# Patient Record
Sex: Female | Born: 1985 | Race: White | Hispanic: No | Marital: Married | Smoking: Former smoker
Health system: Southern US, Community
[De-identification: ages and names within clinical notes are randomized; demographics above are authoritative.]

## PROBLEM LIST (undated history)

## (undated) ENCOUNTER — Inpatient Hospital Stay (HOSPITAL_COMMUNITY): Payer: Self-pay

## (undated) DIAGNOSIS — O4290 Premature rupture of membranes, unspecified as to length of time between rupture and onset of labor, unspecified weeks of gestation: Secondary | ICD-10-CM

## (undated) DIAGNOSIS — Z3490 Encounter for supervision of normal pregnancy, unspecified, unspecified trimester: Secondary | ICD-10-CM

## (undated) DIAGNOSIS — L309 Dermatitis, unspecified: Secondary | ICD-10-CM

## (undated) DIAGNOSIS — Z87448 Personal history of other diseases of urinary system: Secondary | ICD-10-CM

## (undated) DIAGNOSIS — R87619 Unspecified abnormal cytological findings in specimens from cervix uteri: Secondary | ICD-10-CM

## (undated) DIAGNOSIS — IMO0002 Reserved for concepts with insufficient information to code with codable children: Secondary | ICD-10-CM

## (undated) DIAGNOSIS — E669 Obesity, unspecified: Secondary | ICD-10-CM

## (undated) DIAGNOSIS — F419 Anxiety disorder, unspecified: Secondary | ICD-10-CM

## (undated) HISTORY — PX: TONSILLECTOMY: SUR1361

## (undated) HISTORY — DX: Anxiety disorder, unspecified: F41.9

## (undated) HISTORY — DX: Personal history of other diseases of urinary system: Z87.448

## (undated) HISTORY — DX: Dermatitis, unspecified: L30.9

## (undated) HISTORY — PX: COLPOSCOPY: SHX161

---
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
## 2005-01-22 ENCOUNTER — Emergency Department: Payer: Self-pay | Admitting: Emergency Medicine

## 2007-12-12 ENCOUNTER — Emergency Department: Payer: Self-pay | Admitting: Emergency Medicine

## 2008-04-16 ENCOUNTER — Ambulatory Visit: Payer: Self-pay | Admitting: Family Medicine

## 2009-02-28 ENCOUNTER — Emergency Department: Payer: Self-pay | Admitting: Emergency Medicine

## 2010-01-24 NOTE — L&D Delivery Note (Signed)
This is Dr. Ambrose Mantle dictating a delivery note on [facility]. This lady progressed to full dilatation, pushed well, and delivered a living 7 lbs. 7 oz. Female infant with Apgars of 9 at one and 9 at 5 minutes. The patient wanted her blood collected for the blood bank, so after I collected the routine cord blood studies I did a sterile cord blood collection. The placenta had separated partially so Pitocin was begun and the placenta delivered intact. The uterus was examined and was free of any remaining products of conception. A rectal exam was done and there was no rectal injury. A second degree midline laceration was repaired with 3-0 Vicryl under local 1% Xylocaine block. Re-exam of the uterus showed no clots in the uterus and the procedure was terminated. Blood loss was about 400 cc.

## 2010-08-02 ENCOUNTER — Inpatient Hospital Stay (HOSPITAL_COMMUNITY): Payer: 59 | Admitting: Anesthesiology

## 2010-08-02 ENCOUNTER — Encounter (HOSPITAL_COMMUNITY): Payer: Self-pay | Admitting: Anesthesiology

## 2010-08-02 ENCOUNTER — Inpatient Hospital Stay (HOSPITAL_COMMUNITY)
Admission: AD | Admit: 2010-08-02 | Discharge: 2010-08-04 | DRG: 775 | Disposition: A | Discharge status: Home / Self Care | Payer: 59 | Source: Ambulatory Visit | Attending: Obstetrics and Gynecology | Admitting: Obstetrics and Gynecology

## 2010-08-02 ENCOUNTER — Encounter (HOSPITAL_COMMUNITY): Payer: Self-pay | Admitting: *Deleted

## 2010-08-02 DIAGNOSIS — O4290 Premature rupture of membranes, unspecified as to length of time between rupture and onset of labor, unspecified weeks of gestation: Secondary | ICD-10-CM | POA: Diagnosis present

## 2010-08-02 HISTORY — DX: Unspecified abnormal cytological findings in specimens from cervix uteri: R87.619

## 2010-08-02 HISTORY — DX: Reserved for concepts with insufficient information to code with codable children: IMO0002

## 2010-08-02 HISTORY — DX: Premature rupture of membranes, unspecified as to length of time between rupture and onset of labor, unspecified weeks of gestation: O42.90

## 2010-08-02 LAB — CBC
HCT: 35.5 % — ABNORMAL LOW (ref 36.0–46.0)
Hemoglobin: 11.6 g/dL — ABNORMAL LOW (ref 12.0–15.0)
MCH: 28 pg (ref 26.0–34.0)
MCHC: 32.7 g/dL (ref 30.0–36.0)
MCV: 85.5 fL (ref 78.0–100.0)
Platelets: 281 10*3/uL (ref 150–400)
RBC: 4.15 MIL/uL (ref 3.87–5.11)
RDW: 14.4 % (ref 11.5–15.5)
WBC: 13.4 10*3/uL — ABNORMAL HIGH (ref 4.0–10.5)

## 2010-08-02 LAB — STREP B DNA PROBE: GBS: NEGATIVE

## 2010-08-02 LAB — HEPATITIS B SURFACE ANTIGEN: Hepatitis B Surface Ag: NEGATIVE

## 2010-08-02 LAB — RUBELLA ANTIBODY, IGM: Rubella: IMMUNE

## 2010-08-02 LAB — RPR
RPR Ser Ql: NONREACTIVE
RPR: NONREACTIVE
RPR: NONREACTIVE

## 2010-08-02 LAB — HIV ANTIBODY (ROUTINE TESTING W REFLEX)
HIV: NONREACTIVE
HIV: NONREACTIVE

## 2010-08-02 LAB — ABO/RH: RH Type: POSITIVE

## 2010-08-02 LAB — TYPE AND SCREEN: Antibody Screen: NEGATIVE

## 2010-08-02 MED ORDER — ERYTHROMYCIN 5 MG/GM OP OINT
TOPICAL_OINTMENT | OPHTHALMIC | Status: AC
  Filled 2010-08-02: qty 1

## 2010-08-02 MED ORDER — LIDOCAINE HCL (PF) 1 % IJ SOLN
30.0000 mL | Freq: Once | INTRAMUSCULAR | Status: DC | PRN
  Administered 2010-08-02: 30 mL via SUBCUTANEOUS
  Filled 2010-08-02: qty 30

## 2010-08-02 MED ORDER — LACTATED RINGERS IV SOLN: 500.0000 mL | Freq: Once | INTRAVENOUS | Status: DC | PRN

## 2010-08-02 MED ORDER — LIDOCAINE HCL 1.5 % IJ SOLN
INTRAMUSCULAR | Status: DC | PRN
  Administered 2010-08-02: 10 mL

## 2010-08-02 MED ORDER — EPHEDRINE 5 MG/ML INJ
10.0000 mg | INTRAVENOUS | Status: DC | PRN
  Filled 2010-08-02: qty 4

## 2010-08-02 MED ORDER — BENZOCAINE-MENTHOL 20-0.5 % EX AERO
INHALATION_SPRAY | CUTANEOUS | Status: AC
  Filled 2010-08-02: qty 56

## 2010-08-02 MED ORDER — IBUPROFEN 600 MG PO TABS: 600.0000 mg | ORAL_TABLET | Freq: Four times a day (QID) | ORAL | Status: DC | PRN

## 2010-08-02 MED ORDER — LACTATED RINGERS IV SOLN
INTRAVENOUS | Status: DC
  Administered 2010-08-02: 500 mL via INTRAVENOUS
  Administered 2010-08-02 (×2): via INTRAVENOUS

## 2010-08-02 MED ORDER — OXYTOCIN 20 UNITS IN LACTATED RINGERS INFUSION - SIMPLE
2.0000 m[IU]/min | INTRAVENOUS | Status: DC
  Administered 2010-08-02: 8 m[IU]/min via INTRAVENOUS
  Administered 2010-08-02: 2 m[IU]/min via INTRAVENOUS
  Filled 2010-08-02: qty 1000

## 2010-08-02 MED ORDER — PRENATAL PLUS 27-1 MG PO TABS
1.0000 | ORAL_TABLET | Freq: Every day | ORAL | Status: DC
Start: 2010-08-02 — End: 2010-08-02

## 2010-08-02 MED ORDER — LACTATED RINGERS IV SOLN: 500.0000 mL | Freq: Once | INTRAVENOUS | Status: DC

## 2010-08-02 MED ORDER — CITRIC ACID-SODIUM CITRATE 334-500 MG/5ML PO SOLN: 30.0000 mL | ORAL | Status: DC | PRN

## 2010-08-02 MED ORDER — FENTANYL 2.5 MCG/ML BUPIVACAINE 1/10 % EPIDURAL INFUSION (WH - ANES)
2.0000 mL/h | INTRAMUSCULAR | Status: DC
  Administered 2010-08-02 (×3): 14 mL/h via EPIDURAL
  Filled 2010-08-02 (×3): qty 60

## 2010-08-02 MED ORDER — DIPHENHYDRAMINE HCL 50 MG/ML IJ SOLN: 12.5000 mg | INTRAMUSCULAR | Status: DC | PRN

## 2010-08-02 MED ORDER — ACETAMINOPHEN 325 MG PO TABS: 650.0000 mg | ORAL_TABLET | ORAL | Status: DC | PRN

## 2010-08-02 MED ORDER — PHENYLEPHRINE 40 MCG/ML (10ML) SYRINGE FOR IV PUSH (FOR BLOOD PRESSURE SUPPORT): 80.0000 ug | PREFILLED_SYRINGE | INTRAVENOUS | Status: DC | PRN

## 2010-08-02 MED ORDER — OXYCODONE-ACETAMINOPHEN 5-325 MG PO TABS
ORAL_TABLET | ORAL | Status: AC
  Administered 2010-08-02: 1 via ORAL
  Filled 2010-08-02: qty 1

## 2010-08-02 MED ORDER — FLEET ENEMA 7-19 GM/118ML RE ENEM: 1.0000 | ENEMA | RECTAL | Status: DC | PRN

## 2010-08-02 MED ORDER — TERBUTALINE SULFATE 1 MG/ML IJ SOLN: 0.2500 mg | Freq: Once | INTRAMUSCULAR | Status: DC | PRN

## 2010-08-02 MED ORDER — OXYTOCIN 20 UNITS IN LACTATED RINGERS INFUSION - SIMPLE
125.0000 mL/h | Freq: Once | INTRAVENOUS | Status: DC
  Administered 2010-08-02: 999 mL/h via INTRAVENOUS

## 2010-08-02 MED ORDER — EPHEDRINE 5 MG/ML INJ: 10.0000 mg | INTRAVENOUS | Status: DC | PRN

## 2010-08-02 MED ORDER — PHENYLEPHRINE 40 MCG/ML (10ML) SYRINGE FOR IV PUSH (FOR BLOOD PRESSURE SUPPORT)
80.0000 ug | PREFILLED_SYRINGE | INTRAVENOUS | Status: DC | PRN
  Filled 2010-08-02: qty 5

## 2010-08-02 MED ORDER — ONDANSETRON HCL 4 MG/2ML IJ SOLN: 4.0000 mg | Freq: Four times a day (QID) | INTRAMUSCULAR | Status: DC | PRN

## 2010-08-02 NOTE — Initial Assessments (Signed)
Pt G1 at 40.3wks, reports leaking clear fluid with small amt of blood since 0200, and having contractions every .  Pt denies any problems with pregnancy.

## 2010-08-02 NOTE — H&P (Signed)
Ann [name] is a 25 y.o. female presenting for SROM at 40 3/7 weeks  (EDD 07/30/10 by LMP).  Transferred to our practice from another at 35 weeks with uncomplicated care. Maternal Medical History:  Reason for admission: Reason for admission: rupture of membranes.  Contractions: Onset was 3-5 hours ago.   Frequency: irregular.   Duration is approximately 45 seconds.   Perceived severity is mild.    Fetal activity: Perceived fetal activity is normal.   Last perceived fetal movement was within the past hour.    Prenatal complications: no prenatal complications   OB History    Grav Para Term Preterm Abortions TAB SAB Ect Mult Living   1         0     Past Medical History  Diagnosis Date  . Abnormal Pap smear     biopsy in september   Past Surgical History  Procedure Date  . Tonsillectomy    Family History: family history is not on file. Social History:  reports that she has never smoked. She does not have any smokeless tobacco history on file. She reports that she does not drink alcohol or use illicit drugs.  Review of Systems  Constitutional: Negative.   HENT: Negative.   Respiratory: Negative.   Cardiovascular: Negative.   Genitourinary: Negative.   Skin: Negative.   All other systems reviewed and are negative.    Dilation: 2 Effacement (%): 50 Station: -1 Exam by:: Rudi Coco RN Blood pressure 128/75, pulse 84, temperature 98 F (36.7 C), temperature source Oral, resp. rate 20, height 5\' 3"  (1.6 m), weight 93.078 kg (205 lb 3.2 oz). Maternal Exam:  Uterine Assessment: Contraction strength is mild.  Contraction duration is 45 seconds. Contraction frequency is irregular.   Abdomen: Patient reports no abdominal tenderness. Fundal height is 37.   Estimated fetal weight is 7lbs.   Fetal presentation: vertex  Introitus: Normal vulva. Normal vagina.    Physical Exam  Constitutional: She is oriented to person, place, and time. She appears well-developed.  Eyes: Pupils  are equal, round, and reactive to light.  Neck: Neck supple.  Cardiovascular: Normal rate and regular rhythm.   Respiratory: Effort normal and breath sounds normal.  GI: Soft.  Genitourinary: Vagina normal.  Musculoskeletal: Normal range of motion.  Neurological: She is oriented to person, place, and time.  Skin: Skin is warm.  Psychiatric: Her behavior is normal.    Prenatal labs: ABO, Rh:  A positive Antibody: Negative (07/09 0000) Rubella:  Immune RPR: Nonreactive (07/09 0000)  HBsAg: Negative (07/09 0000)  HIV: Non-reactive (07/09 0000)  GBS: Negative (07/09 0000)  One hour glucola   100 GC negative Chlam negative Genetic screens not done Assessment/Plan: Pt with SROM at about 200am, irregular contractions only that are mild.  d/w pt and will begin pitocin at 2mu and increase by 2mu increments.  Plans epidural when in labor. Currently pt is comfortable.  Ann [name] 08/02/2010, 6:49 AM

## 2010-08-02 NOTE — Progress Notes (Signed)
rom @ 0200 - clear, pain started an hr later.  G1p0...41wks

## 2010-08-02 NOTE — Anesthesia Procedure Notes (Signed)
Epidural Patient location during procedure: OB Start time: 08/02/2010 7:55 AM Reason for block: procedure for pain  Staffing Performed by: anesthesiologist   Preanesthetic Checklist Completed: patient identified, site marked, surgical consent, pre-op evaluation, timeout performed, IV checked, risks and benefits discussed and monitors and equipment checked  Epidural Patient position: sitting Prep: DuraPrep Patient monitoring: continuous pulse ox Approach: midline Injection technique: LOR saline  Needle Needle type: Tuohy  Needle gauge: 17 G Needle length: 9 cm Catheter type: closed end flexible Catheter size: 19 Gauge Test dose: 1.5% lidocaine  Assessment Events: blood not aspirated, injection not painful, no injection resistance, negative IV test and no paresthesia  Additional Notes Patient identified.  Risk benefits discussed including failed block, incomplete pain control, headache, nerve damage, paralysis, blood pressure changes, reactions to medication both toxic or allergic, and postpartum back pain.  Patient expressed understanding and wished to proceed.  All questions were answered.  Sterile technique used throughout procedure and epidural site dressed with sterile barrier dressing.  Please see nursing notes for vital signs.

## 2010-08-02 NOTE — Anesthesia Preprocedure Evaluation (Signed)
Anesthesia Evaluation  Name, MR# and DOB Patient awake  General Assessment Comment  Reviewed: Allergy & Precautions, H&P  and Patient's Chart, lab work & pertinent test results  Airway Mallampati: II TM Distance: >3 FB Neck ROM: full    Dental   Pulmonary  clear to auscultation    Cardiovascular Exercise Tolerance: Good regular Normal   Neuro/Psych  GI/Hepatic/Renal   Endo/Other   Abdominal   Musculoskeletal  Hematology   Peds  Reproductive/Obstetrics (+) Pregnancy          Anesthesia Physical Anesthesia Plan  ASA: II  Anesthesia Plan: Epidural   Post-op Pain Management:    Induction:   Airway Management Planned:   Additional Equipment:   Intra-op Plan:   Post-operative Plan:   Informed Consent: I have reviewed the patients History and Physical, chart, labs and discussed the procedure including the risks, benefits and alternatives for the proposed anesthesia with the patient or authorized representative who has indicated his/her understanding and acceptance.     Plan Discussed with:   Anesthesia Plan Comments:         Anesthesia Quick Evaluation

## 2010-08-02 NOTE — Progress Notes (Signed)
  There have been some decelerations that have been treated with position change and O2 by mask. The pitocin is at 6 mu/ min and the cervix is 7 cm 100 % and vertex is at 0 station

## 2010-08-02 NOTE — Progress Notes (Signed)
This is Dr. Adorian Gwynne dictating a delivery note on Ann [name]. This lady progressed to full dilatation, pushed well, and delivered a living 7 lbs. 7 oz. Female infant with Apgars of 9 at one and 9 at 5 minutes. The patient wanted her blood collected for the blood bank, so after I collected the routine cord blood studies I did a sterile cord blood collection. The placenta had separated partially so Pitocin was begun and the placenta delivered intact. The uterus was examined and was free of any remaining products of conception. A rectal exam was done and there was no rectal injury. A second degree midline laceration was repaired with 3-0 Vicryl under local 1% Xylocaine block. Re-exam of the uterus showed no clots in the uterus and the procedure was terminated. Blood loss was about 400 cc. 

## 2010-08-02 NOTE — Progress Notes (Signed)
Ann [name] is a 25 y.o. G1P0 at [redacted]w[redacted]d by LMP admitted for SROM  Subjective: Comfortable with epidural  Objective: BP 107/63  Pulse 94  Temp(Src) 98 F (36.7 C) (Oral)  Resp 18  Ht 5\' 3"  (1.6 m)  Wt 93.078 kg (205 lb 3.2 oz)  BMI 36.35 kg/m2  SpO2 99%      FHT:  FHR: 140 bpm, variability: moderate,  accelerations:  Present,  decelerations:  Absent UC:   regular, every 2-3 minutes SVE:   Dilation: 3.5 Effacement (%): 80 Station: -1 Exam by:: Dr Ellyn Hack  Labs: Lab Results  Component Value Date   WBC 13.4* 08/02/2010   HGB 11.6* 08/02/2010   HCT 35.5* 08/02/2010   MCV 85.5 08/02/2010   PLT 281 08/02/2010    Assessment / Plan: Augmentation of labor, progressing well  Labor: Progressing normally, pit at 65mu/min Preeclampsia:  no signs or symptoms of toxicity Fetal Wellbeing:  Category I Pain Control:  Epidural I/D:  n/a Anticipated MOD:  NSVD  BOVARD,Ayo Smoak 08/02/2010, 9:06 AM

## 2010-08-03 LAB — CBC
HCT: 31.7 % — ABNORMAL LOW (ref 36.0–46.0)
Hemoglobin: 10.2 g/dL — ABNORMAL LOW (ref 12.0–15.0)
MCH: 27.7 pg (ref 26.0–34.0)
MCHC: 32.2 g/dL (ref 30.0–36.0)
MCV: 86.1 fL (ref 78.0–100.0)
Platelets: 235 10*3/uL (ref 150–400)
RBC: 3.68 MIL/uL — ABNORMAL LOW (ref 3.87–5.11)
RDW: 14.3 % (ref 11.5–15.5)
WBC: 17.2 10*3/uL — ABNORMAL HIGH (ref 4.0–10.5)

## 2010-08-03 MED ORDER — BENZOCAINE-MENTHOL 20-0.5 % EX AERO: 1.0000 "application " | INHALATION_SPRAY | CUTANEOUS | Status: DC | PRN

## 2010-08-03 MED ORDER — ONDANSETRON HCL 4 MG PO TABS: 4.0000 mg | ORAL_TABLET | ORAL | Status: DC | PRN

## 2010-08-03 MED ORDER — MEASLES, MUMPS & RUBELLA VAC ~~LOC~~ INJ
0.5000 mL | INJECTION | Freq: Once | SUBCUTANEOUS | Status: DC
  Filled 2010-08-03: qty 0.5

## 2010-08-03 MED ORDER — ONDANSETRON HCL 4 MG/2ML IJ SOLN: 4.0000 mg | INTRAMUSCULAR | Status: DC | PRN

## 2010-08-03 MED ORDER — PRENATAL PLUS 27-1 MG PO TABS
1.0000 | ORAL_TABLET | Freq: Every day | ORAL | Status: DC
  Administered 2010-08-04: 1 via ORAL
  Filled 2010-08-03 (×2): qty 1

## 2010-08-03 MED ORDER — OXYCODONE-ACETAMINOPHEN 5-325 MG PO TABS
1.0000 | ORAL_TABLET | ORAL | Status: DC | PRN
  Administered 2010-08-02: 1 via ORAL
  Administered 2010-08-03: 2 via ORAL
  Filled 2010-08-03: qty 2

## 2010-08-03 MED ORDER — ZOLPIDEM TARTRATE 5 MG PO TABS: 5.0000 mg | ORAL_TABLET | Freq: Every evening | ORAL | Status: DC | PRN

## 2010-08-03 MED ORDER — RHO D IMMUNE GLOBULIN 1500 UNIT/2ML IJ SOLN: 300.0000 ug | Freq: Once | INTRAMUSCULAR | Status: DC

## 2010-08-03 MED ORDER — TETANUS-DIPHTH-ACELL PERTUSSIS 5-2.5-18.5 LF-MCG/0.5 IM SUSP
0.5000 mL | Freq: Once | INTRAMUSCULAR | Status: DC
  Filled 2010-08-03: qty 0.5

## 2010-08-03 MED ORDER — OXYTOCIN 20 UNITS IN LACTATED RINGERS INFUSION - SIMPLE: 125.0000 mL/h | INTRAVENOUS | Status: AC

## 2010-08-03 MED ORDER — FAMOTIDINE 20 MG PO TABS
20.0000 mg | ORAL_TABLET | Freq: Two times a day (BID) | ORAL | Status: DC
  Administered 2010-08-03: 20 mg via ORAL
  Filled 2010-08-03: qty 1

## 2010-08-03 MED ORDER — LANOLIN HYDROUS EX OINT: TOPICAL_OINTMENT | CUTANEOUS | Status: DC | PRN

## 2010-08-03 MED ORDER — DIPHENHYDRAMINE HCL 25 MG PO CAPS: 25.0000 mg | ORAL_CAPSULE | Freq: Four times a day (QID) | ORAL | Status: DC | PRN

## 2010-08-03 MED ORDER — SENNOSIDES-DOCUSATE SODIUM 8.6-50 MG PO TABS
1.0000 | ORAL_TABLET | Freq: Every day | ORAL | Status: DC
  Administered 2010-08-03: 1 via ORAL
  Filled 2010-08-03 (×2): qty 2

## 2010-08-03 MED ORDER — WITCH HAZEL-GLYCERIN EX PADS: MEDICATED_PAD | CUTANEOUS | Status: DC | PRN

## 2010-08-03 MED ORDER — SIMETHICONE 80 MG PO CHEW: 80.0000 mg | CHEWABLE_TABLET | ORAL | Status: DC | PRN

## 2010-08-03 MED ORDER — IBUPROFEN 600 MG PO TABS
600.0000 mg | ORAL_TABLET | Freq: Four times a day (QID) | ORAL | Status: DC
  Administered 2010-08-03 – 2010-08-04 (×6): 600 mg via ORAL
  Filled 2010-08-03 (×5): qty 1

## 2010-08-03 NOTE — Progress Notes (Signed)
  PP#1:  Afebrile, BP normal. HGB stable. No complaints.

## 2010-08-04 ENCOUNTER — Encounter (HOSPITAL_COMMUNITY): Payer: Self-pay | Admitting: Obstetrics and Gynecology

## 2010-08-04 DIAGNOSIS — O4290 Premature rupture of membranes, unspecified as to length of time between rupture and onset of labor, unspecified weeks of gestation: Secondary | ICD-10-CM

## 2010-08-04 HISTORY — DX: Premature rupture of membranes, unspecified as to length of time between rupture and onset of labor, unspecified weeks of gestation: O42.90

## 2010-08-04 MED ORDER — IBUPROFEN 600 MG PO TABS: 600.0000 mg | ORAL_TABLET | Freq: Four times a day (QID) | ORAL | Status: AC

## 2010-08-04 NOTE — Progress Notes (Signed)
  Afebrile, BP normal. Ready for discharge.

## 2010-08-04 NOTE — Discharge Summary (Signed)
Obstetric Discharge Summary Reason for Admission: rupture of membranes Prenatal Procedures: none Intrapartum Procedures: spontaneous vaginal delivery Postpartum Procedures: none Complications-Operative and Postpartum: 2 degree perineal laceration  Hemoglobin  Date Value Range Status  08/03/2010 10.2* 12.0-15.0 (g/dL) Final     HCT  Date Value Range Status  08/03/2010 31.7* 36.0-46.0 (%) Final    Discharge Diagnoses: Term Pregnancy-delivered  Discharge Information: Date: 08/04/2010 Activity: pelvic rest Diet: routine Medications: Ibuprophen Condition: improved Instructions: refer to practice specific booklet Discharge to: home Follow-up Information    Follow up with Janaysia Mcleroy F. Make an appointment in 6 weeks.   Contact information:   [employer] [address] [place], [address] [place] [address] [phone]          Newborn Data: Live born  Information for the patient's newborn:  Basha, Krygier [[id number]]  female ; APGAR : 9/9 ; weight ; 7lbs.7 oz. Home with mother.  Cameo Shewell F 08/04/2010, 9:07 AM

## 2010-08-04 NOTE — Progress Notes (Signed)
BREASTFEEDING DISCHARGE TEACHING DONE WITH PATIENT.  LATCH SCORE 9 OBSERVED.  C/O TENDER NIPPLES.  REVIEWED PROPER TECHNIQUES FOR POSITIONING AND LATCH.  COMFORT GELS GIVEN TO PATIENT WITH INSTRUCTIONS.

## 2010-08-19 NOTE — Anesthesia Postprocedure Evaluation (Signed)
  Anesthesia Post-op Note  Patient: Ann [name]  Procedure(s) Performed: * No procedures listed *  Patient stable following vaginal delivery.

## 2011-01-25 NOTE — L&D Delivery Note (Signed)
Delivery Note At 3:47 PM a viable female was delivered via  (Presentation: OA; ROT ).  APGAR: 9,9 ; weight P.   Placenta status: delivered, intact.  Cord: 3 Vessels with the following complications: nuchal .    Anesthesia: Epidural  Episiotomy: none  Lacerations: perineal abrasion Suture Repair: 3.0 vicryl rapide Est. Blood Loss (mL): 400  Mom to postpartum.  Baby to stay with mom.  [name],Ann Simar 01/03/2012, 4:08 PM Br/A+/ IUD PP

## 2011-05-08 ENCOUNTER — Emergency Department: Payer: Self-pay | Admitting: Emergency Medicine

## 2011-06-17 LAB — OB RESULTS CONSOLE RPR: RPR: NONREACTIVE

## 2011-06-17 LAB — OB RESULTS CONSOLE GC/CHLAMYDIA
Chlamydia: NEGATIVE
Gonorrhea: NEGATIVE

## 2011-06-17 LAB — OB RESULTS CONSOLE HIV ANTIBODY (ROUTINE TESTING): HIV: NONREACTIVE

## 2011-10-31 ENCOUNTER — Inpatient Hospital Stay (HOSPITAL_COMMUNITY)
Admission: AD | Admit: 2011-10-31 | Discharge: 2011-11-01 | Disposition: A | Discharge status: Home / Self Care | Payer: 59 | Source: Ambulatory Visit | Attending: Obstetrics and Gynecology | Admitting: Obstetrics and Gynecology

## 2011-10-31 DIAGNOSIS — O99891 Other specified diseases and conditions complicating pregnancy: Secondary | ICD-10-CM | POA: Insufficient documentation

## 2011-10-31 DIAGNOSIS — R079 Chest pain, unspecified: Secondary | ICD-10-CM | POA: Insufficient documentation

## 2011-10-31 DIAGNOSIS — R109 Unspecified abdominal pain: Secondary | ICD-10-CM | POA: Insufficient documentation

## 2011-10-31 DIAGNOSIS — R197 Diarrhea, unspecified: Secondary | ICD-10-CM | POA: Insufficient documentation

## 2011-10-31 DIAGNOSIS — R1013 Epigastric pain: Secondary | ICD-10-CM

## 2011-10-31 NOTE — MAU Note (Signed)
Pt G2 P1 at 31.3wks, diarrhea all day, upper abd pain, chest pain, lower and upper back pain.  Pain seems to move around and radiates from chest to back.  Pt only able to eat one meal today.

## 2011-11-01 ENCOUNTER — Encounter (HOSPITAL_COMMUNITY): Payer: Self-pay | Admitting: *Deleted

## 2011-11-01 LAB — URINALYSIS, ROUTINE W REFLEX MICROSCOPIC
Bilirubin Urine: NEGATIVE
Glucose, UA: NEGATIVE mg/dL
Hgb urine dipstick: NEGATIVE
Ketones, ur: NEGATIVE mg/dL
Nitrite: NEGATIVE
Protein, ur: NEGATIVE mg/dL
Specific Gravity, Urine: 1.01 (ref 1.005–1.030)
Urobilinogen, UA: 0.2 mg/dL (ref 0.0–1.0)
pH: 5.5 (ref 5.0–8.0)

## 2011-11-01 LAB — CBC WITH DIFFERENTIAL/PLATELET
Basophils Absolute: 0 10*3/uL (ref 0.0–0.1)
Basophils Relative: 0 % (ref 0–1)
Eosinophils Absolute: 0.1 10*3/uL (ref 0.0–0.7)
Eosinophils Relative: 1 % (ref 0–5)
HCT: 36.4 % (ref 36.0–46.0)
Hemoglobin: 11.8 g/dL — ABNORMAL LOW (ref 12.0–15.0)
Lymphocytes Relative: 15 % (ref 12–46)
Lymphs Abs: 1.4 10*3/uL (ref 0.7–4.0)
MCH: 26.8 pg (ref 26.0–34.0)
MCHC: 32.4 g/dL (ref 30.0–36.0)
MCV: 82.7 fL (ref 78.0–100.0)
Monocytes Absolute: 0.8 10*3/uL (ref 0.1–1.0)
Monocytes Relative: 8 % (ref 3–12)
Neutro Abs: 7.3 10*3/uL (ref 1.7–7.7)
Neutrophils Relative %: 77 % (ref 43–77)
Platelets: 248 10*3/uL (ref 150–400)
RBC: 4.4 MIL/uL (ref 3.87–5.11)
RDW: 14.5 % (ref 11.5–15.5)
WBC: 9.5 10*3/uL (ref 4.0–10.5)

## 2011-11-01 LAB — COMPREHENSIVE METABOLIC PANEL
ALT: 8 U/L (ref 0–35)
AST: 14 U/L (ref 0–37)
Albumin: 2.8 g/dL — ABNORMAL LOW (ref 3.5–5.2)
Alkaline Phosphatase: 75 U/L (ref 39–117)
BUN: 8 mg/dL (ref 6–23)
CO2: 20 mEq/L (ref 19–32)
Calcium: 8.5 mg/dL (ref 8.4–10.5)
Chloride: 101 mEq/L (ref 96–112)
Creatinine, Ser: 0.72 mg/dL (ref 0.50–1.10)
GFR calc Af Amer: >90 mL/min (ref >90)
GFR calc non Af Amer: >90 mL/min (ref >90)
Glucose, Bld: 100 mg/dL — ABNORMAL HIGH (ref 70–99)
Potassium: 4.1 mEq/L (ref 3.5–5.1)
Sodium: 133 mEq/L — ABNORMAL LOW (ref 135–145)
Total Bilirubin: 0.3 mg/dL (ref 0.3–1.2)
Total Protein: 6.2 g/dL (ref 6.0–8.3)

## 2011-11-01 LAB — LIPASE, BLOOD: Lipase: 41 U/L (ref 11–59)

## 2011-11-01 LAB — URINE MICROSCOPIC-ADD ON

## 2011-11-01 LAB — AMYLASE: Amylase: 77 U/L (ref 0–105)

## 2011-11-01 MED ORDER — GI COCKTAIL ~~LOC~~
30.0000 mL | Freq: Once | ORAL | Status: AC
  Administered 2011-11-01: 30 mL via ORAL
  Filled 2011-11-01: qty 30

## 2011-11-01 MED ORDER — RANITIDINE HCL 150 MG PO TABS: 150.0000 mg | ORAL_TABLET | Freq: Two times a day (BID) | ORAL | Status: DC

## 2011-11-01 NOTE — MAU Provider Note (Signed)
History     CSN: [id number]  Arrival date and time: 10/31/11 2342   First Provider Initiated Contact with Patient 11/01/11 0032      Chief Complaint  Patient presents with  . Diarrhea  . Abdominal Pain  . Back Pain  . Chest Pain   HPI Ann [name] is a 26 y.o. female @ [redacted]w[redacted]d gestation who presents to MAU with chest pain. She describes the pain as sharp that was located in the middle of the chest. The pain radiated through to the back. She rated the pain as 8/10. The pain was worse with lying flat. Patient ate Nachos at 4:30 pm. Now the pain is 4/10. Also complains of upper abdominal pain that radiates to the back. Diarrhea since yesterday. Stools are watery, yellow. Has had 5 or 6 today. Symptoms have improved some since earlier. The history was provided by the patient.  OB History    Grav Para Term Preterm Abortions TAB SAB Ect Mult Living   2 1 1       1       Past Medical History  Diagnosis Date  . Abnormal Pap smear     biopsy in september  . Postpartum care following vaginal delivery 08/04/2010  . ROM (rupture of membranes), premature 08/04/2010    Past Surgical History  Procedure Date  . Tonsillectomy     History reviewed. No pertinent family history.  History  Substance Use Topics  . Smoking status: Former Games developer  . Smokeless tobacco: Not on file  . Alcohol Use: No    Allergies:  Allergies  Allergen Reactions  . Darvocet (Propoxyphene-Acetaminophen)   . Latex     Prescriptions prior to admission  Medication Sig Dispense Refill  . prenatal vitamin w/FE, FA (PRENATAL 1 + 1) 27-1 MG TABS Take 1 tablet by mouth daily.        . ranitidine (ZANTAC) 75 MG tablet Take 75 mg by mouth 2 (two) times daily.          Review of Systems  Constitutional: Negative for fever, chills and weight loss.  HENT: Negative for ear pain, nosebleeds, congestion, sore throat and neck pain.   Eyes: Negative for blurred vision, double vision, photophobia and pain.  Respiratory:  Positive for shortness of breath. Negative for cough and wheezing.   Cardiovascular: Positive for chest pain. Negative for palpitations and leg swelling.  Gastrointestinal: Positive for heartburn, nausea, vomiting (once this morning) and diarrhea. Negative for abdominal pain and constipation.  Genitourinary: Negative for dysuria, urgency and frequency.  Musculoskeletal: Positive for back pain. Negative for myalgias.  Skin: Negative for itching and rash.  Neurological: Positive for headaches. Negative for dizziness, sensory change, speech change, seizures and weakness.  Endo/Heme/Allergies: Does not bruise/bleed easily.  Psychiatric/Behavioral: Negative for depression. The patient is not nervous/anxious and does not have insomnia.    Physical Exam   Blood pressure 110/65, pulse 114, temperature 98.3 F (36.8 C), temperature source Oral, resp. rate 18, height 5\' 3"  (1.6 m), weight 214 lb 9.6 oz (97.342 kg), not currently breastfeeding.  Physical Exam  Nursing note and vitals reviewed. Constitutional: She is oriented to person, place, and time. She appears well-developed and well-nourished. No distress.  HENT:  Head: Normocephalic and atraumatic.  Eyes: EOM are normal.  Neck: Neck supple.  Cardiovascular:       tachycardia  Respiratory: Effort normal.  GI: Soft. There is tenderness in the epigastric area. There is no rebound, no guarding and no CVA tenderness.  Musculoskeletal: Normal range of motion.  Neurological: She is alert and oriented to person, place, and time.  Skin: Skin is warm and dry.  Psychiatric: She has a normal mood and affect. Her behavior is normal. Judgment and thought content normal.    EFM: Baseline 150, no contractions, reassuring Procedures Discussed with Dr. Ambrose Mantle, will draw labs and if normal will d/c home to follow up in the office. Patient given GI Cocktail 30 ccs. PO and had complete relief of chest discomfort.  Results for orders placed during the  hospital encounter of 10/31/11 (from the past 24 hour(s))  CBC WITH DIFFERENTIAL     Status: Abnormal   Collection Time   11/01/11  1:20 AM      Component Value Range   WBC 9.5  4.0 - 10.5 K/uL   RBC 4.40  3.87 - 5.11 MIL/uL   Hemoglobin 11.8 (*) 12.0 - 15.0 g/dL   HCT 40.9  81.1 - 91.4 %   MCV 82.7  78.0 - 100.0 fL   MCH 26.8  26.0 - 34.0 pg   MCHC 32.4  30.0 - 36.0 g/dL   RDW 78.2  95.6 - 21.3 %   Platelets 248  150 - 400 K/uL   Neutrophils Relative 77  43 - 77 %   Neutro Abs 7.3  1.7 - 7.7 K/uL   Lymphocytes Relative 15  12 - 46 %   Lymphs Abs 1.4  0.7 - 4.0 K/uL   Monocytes Relative 8  3 - 12 %   Monocytes Absolute 0.8  0.1 - 1.0 K/uL   Eosinophils Relative 1  0 - 5 %   Eosinophils Absolute 0.1  0.0 - 0.7 K/uL   Basophils Relative 0  0 - 1 %   Basophils Absolute 0.0  0.0 - 0.1 K/uL  COMPREHENSIVE METABOLIC PANEL     Status: Abnormal   Collection Time   11/01/11  1:20 AM      Component Value Range   Sodium 133 (*) 135 - 145 mEq/L   Potassium 4.1  3.5 - 5.1 mEq/L   Chloride 101  96 - 112 mEq/L   CO2 20  19 - 32 mEq/L   Glucose, Bld 100 (*) 70 - 99 mg/dL   BUN 8  6 - 23 mg/dL   Creatinine, Ser 0.86  0.50 - 1.10 mg/dL   Calcium 8.5  8.4 - 57.8 mg/dL   Total Protein 6.2  6.0 - 8.3 g/dL   Albumin 2.8 (*) 3.5 - 5.2 g/dL   AST 14  0 - 37 U/L   ALT 8  0 - 35 U/L   Alkaline Phosphatase 75  39 - 117 U/L   Total Bilirubin 0.3  0.3 - 1.2 mg/dL   GFR calc non Af Amer >90  >90 mL/min   GFR calc Af Amer >90  >90 mL/min  LIPASE, BLOOD     Status: Normal   Collection Time   11/01/11  1:20 AM      Component Value Range   Lipase 41  11 - 59 U/L  AMYLASE     Status: Normal   Collection Time   11/01/11  1:20 AM      Component Value Range   Amylase 77  0 - 105 U/L  URINALYSIS, ROUTINE W REFLEX MICROSCOPIC     Status: Abnormal   Collection Time   11/01/11  2:30 AM      Component Value Range   Color, Urine YELLOW  YELLOW  APPearance CLEAR  CLEAR   Specific Gravity, Urine 1.010   1.005 - 1.030   pH 5.5  5.0 - 8.0   Glucose, UA NEGATIVE  NEGATIVE mg/dL   Hgb urine dipstick NEGATIVE  NEGATIVE   Bilirubin Urine NEGATIVE  NEGATIVE   Ketones, ur NEGATIVE  NEGATIVE mg/dL   Protein, ur NEGATIVE  NEGATIVE mg/dL   Urobilinogen, UA 0.2  0.0 - 1.0 mg/dL   Nitrite NEGATIVE  NEGATIVE   Leukocytes, UA MODERATE (*) NEGATIVE  URINE MICROSCOPIC-ADD ON     Status: Abnormal   Collection Time   11/01/11  2:30 AM      Component Value Range   Squamous Epithelial / LPF FEW (*) RARE   WBC, UA 11-20  <3 WBC/hpf   Bacteria, UA FEW (*) RARE   I discussed results with Dr. Ambrose Mantle since urine was voided specimen he does not feel treatment is needed since patient has no UTI symptoms.  Will treat with zantac and patient to follow up in the office.   Assessment: 26 y.o. female @ [redacted]w[redacted]d gestation with chest discomfort   GERD  Plan:  Zantac   Follow up in the office, return here as needed. Discussed with the patient and all questioned fully answered. She will return if any problems arise.   Medication List     As of 11/07/2011 12:59 AM    START taking these medications         * ranitidine 150 MG tablet   Commonly known as: ZANTAC   Take 1 tablet (150 mg total) by mouth 2 (two) times daily.     * Notice: This list has 1 medication(s) that are the same as other medications prescribed for you. Read the directions carefully, and ask your doctor or other care provider to review them with you.    CONTINUE taking these medications         prenatal vitamin w/FE, FA 27-1 MG Tabs      * ranitidine 75 MG tablet   Commonly known as: ZANTAC     * Notice: This list has 1 medication(s) that are the same as other medications prescribed for you. Read the directions carefully, and ask your doctor or other care provider to review them with you.        Where to get your medications    These are the prescriptions that you need to pick up.   You may get these medications from any pharmacy.          ranitidine 150 MG tablet            Joniah Bednarski, RN, FNP, [facility] 11/01/2011, 12:34 AM

## 2011-11-01 NOTE — MAU Note (Signed)
SAYS HAS H/A-  AND BACK  HURTS - BUT DOESN'T TAKE ANYTHING FOR IT- HAS TRIED  TYLENOL- DOESN'T WORK.Marland Kitchen   HAS HAD BROWN WATER  DIARRHEA  5-6 X TODAY.Marland Kitchen

## 2011-11-01 NOTE — MAU Note (Signed)
PT SAYS  AT 9AM  SHE VOMITED X1.  SAYS HAD CHEST PAIN AT 10PM  TONIGHT.  THIS IS 7 TH  EPISODE  AND HAS NOT TOLD  THE DR.   O2 SAT-   99.   NOW IN ROOM 6 - PAIN IS NOT AS BAD AS IT WAS  TODAY.

## 2011-11-30 LAB — OB RESULTS CONSOLE GBS: GBS: NEGATIVE

## 2011-12-29 ENCOUNTER — Inpatient Hospital Stay (HOSPITAL_COMMUNITY): Payer: 59

## 2011-12-29 ENCOUNTER — Encounter (HOSPITAL_COMMUNITY): Payer: Self-pay | Admitting: *Deleted

## 2011-12-29 ENCOUNTER — Telehealth (HOSPITAL_COMMUNITY): Payer: Self-pay | Admitting: *Deleted

## 2011-12-29 NOTE — Telephone Encounter (Signed)
Preadmission screen  

## 2012-01-02 ENCOUNTER — Encounter (HOSPITAL_COMMUNITY): Payer: Self-pay

## 2012-01-02 DIAGNOSIS — Z3490 Encounter for supervision of normal pregnancy, unspecified, unspecified trimester: Secondary | ICD-10-CM

## 2012-01-02 HISTORY — DX: Encounter for supervision of normal pregnancy, unspecified, unspecified trimester: Z34.90

## 2012-01-02 MED ORDER — FAMOTIDINE 20 MG PO TABS
20.0000 mg | ORAL_TABLET | Freq: Two times a day (BID) | ORAL | Status: DC
  Filled 2012-01-02: qty 1

## 2012-01-02 MED ORDER — PRENATAL PLUS 27-1 MG PO TABS
1.0000 | ORAL_TABLET | Freq: Every day | ORAL | Status: DC
  Administered 2012-01-03: 1 via ORAL
  Filled 2012-01-02: qty 1
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
## 2012-01-03 ENCOUNTER — Encounter (HOSPITAL_COMMUNITY): Payer: Self-pay

## 2012-01-03 ENCOUNTER — Inpatient Hospital Stay (HOSPITAL_COMMUNITY)
Admission: RE | Admit: 2012-01-03 | Discharge: 2012-01-04 | DRG: 775 | Disposition: A | Discharge status: Home / Self Care | Payer: 59 | Source: Ambulatory Visit | Attending: Obstetrics and Gynecology | Admitting: Obstetrics and Gynecology

## 2012-01-03 ENCOUNTER — Encounter (HOSPITAL_COMMUNITY): Payer: Self-pay | Admitting: Anesthesiology

## 2012-01-03 ENCOUNTER — Inpatient Hospital Stay (HOSPITAL_COMMUNITY): Payer: 59 | Admitting: Anesthesiology

## 2012-01-03 VITALS — BP 102/69 | HR 63 | Temp 98.0°F | Resp 18 | Ht 63.0 in | Wt 219.0 lb

## 2012-01-03 DIAGNOSIS — O480 Post-term pregnancy: Principal | ICD-10-CM | POA: Diagnosis present

## 2012-01-03 DIAGNOSIS — Z3490 Encounter for supervision of normal pregnancy, unspecified, unspecified trimester: Secondary | ICD-10-CM

## 2012-01-03 HISTORY — DX: Encounter for supervision of normal pregnancy, unspecified, unspecified trimester: Z34.90

## 2012-01-03 LAB — CBC
HCT: 35.8 % — ABNORMAL LOW (ref 36.0–46.0)
Hemoglobin: 12 g/dL (ref 12.0–15.0)
MCH: 27.5 pg (ref 26.0–34.0)
MCHC: 33.5 g/dL (ref 30.0–36.0)
MCV: 81.9 fL (ref 78.0–100.0)
Platelets: 324 10*3/uL (ref 150–400)
RBC: 4.37 MIL/uL (ref 3.87–5.11)
RDW: 15 % (ref 11.5–15.5)
WBC: 10.8 10*3/uL — ABNORMAL HIGH (ref 4.0–10.5)

## 2012-01-03 LAB — RPR: RPR Ser Ql: NONREACTIVE

## 2012-01-03 MED ORDER — WITCH HAZEL-GLYCERIN EX PADS: 1.0000 "application " | MEDICATED_PAD | CUTANEOUS | Status: DC | PRN

## 2012-01-03 MED ORDER — ACETAMINOPHEN 325 MG PO TABS: 650.0000 mg | ORAL_TABLET | ORAL | Status: DC | PRN

## 2012-01-03 MED ORDER — BUTORPHANOL TARTRATE 1 MG/ML IJ SOLN: 2.0000 mg | INTRAMUSCULAR | Status: DC | PRN

## 2012-01-03 MED ORDER — LIDOCAINE HCL (PF) 1 % IJ SOLN
INTRAMUSCULAR | Status: DC | PRN
  Administered 2012-01-03 (×2): 5 mL

## 2012-01-03 MED ORDER — SIMETHICONE 80 MG PO CHEW: 80.0000 mg | CHEWABLE_TABLET | ORAL | Status: DC | PRN

## 2012-01-03 MED ORDER — OXYTOCIN BOLUS FROM INFUSION
500.0000 mL | INTRAVENOUS | Status: DC
  Administered 2012-01-03: 500 mL via INTRAVENOUS

## 2012-01-03 MED ORDER — PHENYLEPHRINE 40 MCG/ML (10ML) SYRINGE FOR IV PUSH (FOR BLOOD PRESSURE SUPPORT): 80.0000 ug | PREFILLED_SYRINGE | INTRAVENOUS | Status: DC | PRN

## 2012-01-03 MED ORDER — PRENATAL MULTIVITAMIN CH: 1.0000 | ORAL_TABLET | Freq: Every day | ORAL | Status: DC

## 2012-01-03 MED ORDER — ONDANSETRON HCL 4 MG PO TABS: 4.0000 mg | ORAL_TABLET | ORAL | Status: DC | PRN

## 2012-01-03 MED ORDER — FENTANYL 2.5 MCG/ML BUPIVACAINE 1/10 % EPIDURAL INFUSION (WH - ANES)
14.0000 mL/h | INTRAMUSCULAR | Status: DC
  Administered 2012-01-03: 14 mL/h via EPIDURAL
  Filled 2012-01-03: qty 125

## 2012-01-03 MED ORDER — TETANUS-DIPHTH-ACELL PERTUSSIS 5-2.5-18.5 LF-MCG/0.5 IM SUSP: 0.5000 mL | Freq: Once | INTRAMUSCULAR | Status: DC

## 2012-01-03 MED ORDER — DIPHENHYDRAMINE HCL 25 MG PO CAPS: 25.0000 mg | ORAL_CAPSULE | Freq: Four times a day (QID) | ORAL | Status: DC | PRN

## 2012-01-03 MED ORDER — BENZOCAINE-MENTHOL 20-0.5 % EX AERO: 1.0000 "application " | INHALATION_SPRAY | CUTANEOUS | Status: DC | PRN

## 2012-01-03 MED ORDER — OXYTOCIN 40 UNITS IN LACTATED RINGERS INFUSION - SIMPLE MED
1.0000 m[IU]/min | INTRAVENOUS | Status: DC
  Administered 2012-01-03: 2 m[IU]/min via INTRAVENOUS
  Filled 2012-01-03: qty 1000

## 2012-01-03 MED ORDER — TERBUTALINE SULFATE 1 MG/ML IJ SOLN: 0.2500 mg | Freq: Once | INTRAMUSCULAR | Status: DC | PRN

## 2012-01-03 MED ORDER — OXYCODONE-ACETAMINOPHEN 5-325 MG PO TABS: 1.0000 | ORAL_TABLET | ORAL | Status: DC | PRN

## 2012-01-03 MED ORDER — SENNOSIDES-DOCUSATE SODIUM 8.6-50 MG PO TABS: 2.0000 | ORAL_TABLET | Freq: Every day | ORAL | Status: DC

## 2012-01-03 MED ORDER — PRENATAL MULTIVITAMIN CH
1.0000 | ORAL_TABLET | Freq: Every day | ORAL | Status: DC
  Administered 2012-01-04: 1 via ORAL
  Filled 2012-01-03: qty 1

## 2012-01-03 MED ORDER — LANOLIN HYDROUS EX OINT: TOPICAL_OINTMENT | CUTANEOUS | Status: DC | PRN

## 2012-01-03 MED ORDER — LACTATED RINGERS IV SOLN: 500.0000 mL | Freq: Once | INTRAVENOUS | Status: DC

## 2012-01-03 MED ORDER — ONDANSETRON HCL 4 MG/2ML IJ SOLN: 4.0000 mg | INTRAMUSCULAR | Status: DC | PRN

## 2012-01-03 MED ORDER — IBUPROFEN 600 MG PO TABS: 600.0000 mg | ORAL_TABLET | Freq: Four times a day (QID) | ORAL | Status: DC | PRN

## 2012-01-03 MED ORDER — CITRIC ACID-SODIUM CITRATE 334-500 MG/5ML PO SOLN: 30.0000 mL | ORAL | Status: DC | PRN

## 2012-01-03 MED ORDER — LACTATED RINGERS IV SOLN
INTRAVENOUS | Status: DC
Start: 2012-01-03 — End: 2012-01-03
  Administered 2012-01-03 (×4): via INTRAVENOUS

## 2012-01-03 MED ORDER — OXYTOCIN 40 UNITS IN LACTATED RINGERS INFUSION - SIMPLE MED: 62.5000 mL/h | INTRAVENOUS | Status: DC

## 2012-01-03 MED ORDER — LIDOCAINE HCL (PF) 1 % IJ SOLN
30.0000 mL | INTRAMUSCULAR | Status: DC | PRN
  Filled 2012-01-03: qty 30

## 2012-01-03 MED ORDER — LACTATED RINGERS IV SOLN: INTRAVENOUS | Status: DC

## 2012-01-03 MED ORDER — ONDANSETRON HCL 4 MG/2ML IJ SOLN: 4.0000 mg | Freq: Four times a day (QID) | INTRAMUSCULAR | Status: DC | PRN

## 2012-01-03 MED ORDER — PHENYLEPHRINE 40 MCG/ML (10ML) SYRINGE FOR IV PUSH (FOR BLOOD PRESSURE SUPPORT)
80.0000 ug | PREFILLED_SYRINGE | INTRAVENOUS | Status: DC | PRN
  Filled 2012-01-03: qty 5

## 2012-01-03 MED ORDER — IBUPROFEN 600 MG PO TABS
600.0000 mg | ORAL_TABLET | Freq: Four times a day (QID) | ORAL | Status: DC
  Administered 2012-01-03 – 2012-01-04 (×4): 600 mg via ORAL
  Filled 2012-01-03 (×4): qty 1

## 2012-01-03 MED ORDER — DIBUCAINE 1 % RE OINT: 1.0000 "application " | TOPICAL_OINTMENT | RECTAL | Status: DC | PRN

## 2012-01-03 MED ORDER — DIPHENHYDRAMINE HCL 50 MG/ML IJ SOLN: 12.5000 mg | INTRAMUSCULAR | Status: DC | PRN

## 2012-01-03 MED ORDER — EPHEDRINE 5 MG/ML INJ
10.0000 mg | INTRAVENOUS | Status: DC | PRN
  Filled 2012-01-03: qty 4

## 2012-01-03 MED ORDER — EPHEDRINE 5 MG/ML INJ: 10.0000 mg | INTRAVENOUS | Status: DC | PRN

## 2012-01-03 MED ORDER — LACTATED RINGERS IV SOLN: 500.0000 mL | INTRAVENOUS | Status: DC | PRN

## 2012-01-03 MED ORDER — ZOLPIDEM TARTRATE 5 MG PO TABS: 5.0000 mg | ORAL_TABLET | Freq: Every evening | ORAL | Status: DC | PRN

## 2012-01-03 NOTE — Anesthesia Procedure Notes (Signed)
Epidural Patient location during procedure: OB Start time: 01/03/2012 11:58 AM  Staffing Anesthesiologist: Brayton Caves R Performed by: anesthesiologist   Preanesthetic Checklist Completed: patient identified, site marked, surgical consent, pre-op evaluation, timeout performed, IV checked, risks and benefits discussed and monitors and equipment checked  Epidural Patient position: sitting Prep: site prepped and draped and DuraPrep Patient monitoring: continuous pulse ox and blood pressure Approach: midline Injection technique: LOR air and LOR saline  Needle:  Needle type: Tuohy  Needle gauge: 17 G Needle length: 9 cm and 9 Needle insertion depth: 6 cm Catheter type: closed end flexible Catheter size: 19 Gauge Catheter at skin depth: 11 cm Test dose: negative  Assessment Events: blood not aspirated, injection not painful, no injection resistance, negative IV test and no paresthesia  Additional Notes Patient identified.  Risk benefits discussed including failed block, incomplete pain control, headache, nerve damage, paralysis, blood pressure changes, nausea, vomiting, reactions to medication both toxic or allergic, and postpartum back pain.  Patient expressed understanding and wished to proceed.  All questions were answered.  Sterile technique used throughout procedure and epidural site dressed with sterile barrier dressing. No paresthesia or other complications noted.The patient did not experience any signs of intravascular injection such as tinnitus or metallic taste in mouth nor signs of intrathecal spread such as rapid motor block. Please see nursing notes for vital signs.

## 2012-01-03 NOTE — Progress Notes (Signed)
Epidural catheter removed, tip intact.

## 2012-01-03 NOTE — Progress Notes (Signed)
Ann [name] is a 26 y.o. G2P1001 at [redacted]w[redacted]d by ultrasound admitted for induction of labor due to Elective at term.  Subjective: No c/o's.  +FM, no LOF, no VB, occ ctx  Objective: BP 110/69  Pulse 84  Temp 98.2 F (36.8 C) (Oral)  Resp 18  Ht 5\' 3"  (1.6 m)  Wt 99.338 kg (219 lb)  BMI 38.79 kg/m2  Breastfeeding? Unknown      FHT:  FHR: 130-140 bpm, variability: moderate,  accelerations:  Present,  decelerations:  Absent UC:   irregular, every 5-10 minutes SVE:   Dilation: 3.5 Exam by:: Dr. Ellyn Hack AROM performed for clear fluid, w/o diff/comp  Labs: Lab Results  Component Value Date   WBC 9.5 11/01/2011   HGB 11.8* 11/01/2011   HCT 36.4 11/01/2011   MCV 82.7 11/01/2011   PLT 248 11/01/2011    Assessment / Plan: Induction of labor due to term with favorable cervix for iol  Labor: Progressing normally, pitocin and AROM Preeclampsia:  no signs or symptoms of toxicity Fetal Wellbeing:  Category I Pain Control:  Epidural and IV pain meds I/D:  n/a Anticipated MOD:  NSVD  BOVARD,Lasean Rahming 01/03/2012, 9:05 AM
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
## 2012-01-03 NOTE — Progress Notes (Signed)
Patient ID: Ann [name], female   DOB: 13-Jun-1985, 26 y.o.   MRN: [id number]  Comfortable with epidural  AF VSS gen NAD FHTs  140's, mod var with early decels toco q 4 min  IUPC placed without difficulty or complication  Baby likely asynclitic, will flip side to side  26yo G2P1001 at 40+ iol, elective at term Pitocin at 4 mu, will continue to increase Expect SVD

## 2012-01-04 LAB — CBC
HCT: 33.9 % — ABNORMAL LOW (ref 36.0–46.0)
Hemoglobin: 10.9 g/dL — ABNORMAL LOW (ref 12.0–15.0)
MCH: 26.7 pg (ref 26.0–34.0)
MCHC: 32.2 g/dL (ref 30.0–36.0)
MCV: 82.9 fL (ref 78.0–100.0)
Platelets: 250 10*3/uL (ref 150–400)
RBC: 4.09 MIL/uL (ref 3.87–5.11)
RDW: 15.3 % (ref 11.5–15.5)
WBC: 13.6 10*3/uL — ABNORMAL HIGH (ref 4.0–10.5)

## 2012-01-04 MED ORDER — IBUPROFEN 800 MG PO TABS: 800.0000 mg | ORAL_TABLET | Freq: Three times a day (TID) | ORAL | Status: DC | PRN

## 2012-01-04 MED ORDER — OXYCODONE-ACETAMINOPHEN 5-325 MG PO TABS: 1.0000 | ORAL_TABLET | Freq: Four times a day (QID) | ORAL | Status: DC | PRN

## 2012-01-04 MED ORDER — PRENATAL MULTIVITAMIN CH: 1.0000 | ORAL_TABLET | Freq: Every day | ORAL | Status: DC

## 2012-01-04 NOTE — Anesthesia Postprocedure Evaluation (Signed)
Anesthesia Post Note  Patient: Ann [name]  Procedure(s) Performed: * No procedures listed *  Anesthesia type: Epidural  Patient location: Mother/Baby  Post pain: Pain level controlled  Post assessment: Post-op Vital signs reviewed  Last Vitals:  Filed Vitals:   01/04/12 0620  BP: 96/61  Pulse: 77  Temp: 36.4 C  Resp: 18    Post vital signs: Reviewed  Level of consciousness: awake  Complications: No apparent anesthesia complications

## 2012-01-04 NOTE — Progress Notes (Signed)
Ur chart review completed.  

## 2012-01-04 NOTE — Discharge Summary (Signed)
Obstetric Discharge Summary Reason for Admission: induction of labor Prenatal Procedures: none Intrapartum Procedures: spontaneous vaginal delivery Postpartum Procedures: none Complications-Operative and Postpartum: perineal abrasion laceration Hemoglobin  Date Value Range Status  01/04/2012 10.9* 12.0 - 15.0 g/dL Final     HCT  Date Value Range Status  01/04/2012 33.9* 36.0 - 46.0 % Final    Physical Exam:  General: alert and no distress Lochia: appropriate Uterine Fundus: firm   Discharge Diagnoses: Term Pregnancy-delivered  Discharge Information: Date: 01/04/2012 Activity: pelvic rest Diet: routine Medications: PNV, Ibuprofen and Percocet Condition: stable Instructions: refer to practice specific booklet Discharge to: home Follow-up Information    Follow up with BOVARD,Xitlali Kastens, MD. Schedule an appointment as soon as possible for a visit in 6 weeks.   Contact information:   [address] [place] [address] [phone]          Newborn Data: Live born female  Birth Weight: 6 lb 12.8 oz (3085 g) APGAR: 9, 9  Home with mother.  BOVARD,Angla Delahunt 01/04/2012, 9:01 AM

## 2012-01-04 NOTE — Progress Notes (Signed)
Post Partum Day 1 Subjective: no complaints, up ad lib, voiding, tolerating PO and nl lochia, pain controlled  Objective: Blood pressure 96/61, pulse 77, temperature 97.5 F (36.4 C), temperature source Oral, resp. rate 18, height 5\' 3"  (1.6 m), weight 99.338 kg (219 lb), SpO2 100.00%, unknown if currently breastfeeding.  Physical Exam:  General: alert and no distress Lochia: appropriate Uterine Fundus: firm   Basename 01/04/12 0530 01/03/12 0855  HGB 10.9* 12.0  HCT 33.9* 35.8*    Assessment/Plan: Plan for discharge tomorrow, Breastfeeding and Lactation consult.  Pt desires d/c home after 24hours. Will put orders in - motrin, percocet, pnv, f/u 6 weeks.  Nursery aware.     LOS: 1 day   [name],Ann Sanzone 01/04/2012, 8:48 AM

## 2012-01-06 LAB — TYPE AND SCREEN
ABO/RH(D): A POS
Antibody Screen: POSITIVE
DAT, IgG: NEGATIVE
PT AG Type: NEGATIVE
Unit division: 0
Unit division: 0

## 2012-03-14 ENCOUNTER — Encounter: Payer: Self-pay | Admitting: Obstetrics and Gynecology

## 2013-03-02 ENCOUNTER — Encounter (HOSPITAL_COMMUNITY): Payer: Self-pay | Admitting: Emergency Medicine

## 2013-03-02 ENCOUNTER — Emergency Department (HOSPITAL_COMMUNITY)
Admission: EM | Admit: 2013-03-02 | Discharge: 2013-03-03 | Disposition: A | Discharge status: Home / Self Care | Payer: PRIVATE HEALTH INSURANCE | Attending: Emergency Medicine | Admitting: Emergency Medicine

## 2013-03-02 DIAGNOSIS — Z8781 Personal history of (healed) traumatic fracture: Secondary | ICD-10-CM | POA: Insufficient documentation

## 2013-03-02 DIAGNOSIS — R110 Nausea: Secondary | ICD-10-CM | POA: Insufficient documentation

## 2013-03-02 DIAGNOSIS — Z87891 Personal history of nicotine dependence: Secondary | ICD-10-CM | POA: Insufficient documentation

## 2013-03-02 DIAGNOSIS — Z8742 Personal history of other diseases of the female genital tract: Secondary | ICD-10-CM | POA: Insufficient documentation

## 2013-03-02 DIAGNOSIS — Z872 Personal history of diseases of the skin and subcutaneous tissue: Secondary | ICD-10-CM | POA: Insufficient documentation

## 2013-03-02 DIAGNOSIS — Z87448 Personal history of other diseases of urinary system: Secondary | ICD-10-CM | POA: Insufficient documentation

## 2013-03-02 DIAGNOSIS — F10929 Alcohol use, unspecified with intoxication, unspecified: Secondary | ICD-10-CM

## 2013-03-02 DIAGNOSIS — F1010 Alcohol abuse, uncomplicated: Secondary | ICD-10-CM | POA: Insufficient documentation

## 2013-03-02 DIAGNOSIS — R000 Tachycardia, unspecified: Secondary | ICD-10-CM | POA: Insufficient documentation

## 2013-03-02 NOTE — ED Notes (Signed)
Bed: ZO10WA22 Expected date: 03/02/13 Expected time: 11:23 PM Means of arrival: Ambulance Comments: Syncopal episode
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
## 2013-03-03 LAB — ETHANOL: Alcohol, Ethyl (B): 187 mg/dL — ABNORMAL HIGH (ref 0–11)

## 2013-03-03 MED ORDER — ONDANSETRON HCL 4 MG PO TABS: 4.0000 mg | ORAL_TABLET | Freq: Four times a day (QID) | ORAL | Status: DC

## 2013-03-03 MED ORDER — SODIUM CHLORIDE 0.9 % IV BOLUS (SEPSIS)
1000.0000 mL | Freq: Once | INTRAVENOUS | Status: AC
  Administered 2013-03-03: 1000 mL via INTRAVENOUS

## 2013-03-03 MED ORDER — ONDANSETRON HCL 4 MG/2ML IJ SOLN
4.0000 mg | Freq: Once | INTRAMUSCULAR | Status: AC
  Administered 2013-03-03: 4 mg via INTRAVENOUS
  Filled 2013-03-03: qty 2

## 2013-03-03 NOTE — Discharge Instructions (Signed)
Alcohol Intoxication °Alcohol intoxication occurs when the amount of alcohol that a person has consumed impairs his or her ability to mentally and physically function. Alcohol directly impairs the normal chemical activity of the brain. Drinking large amounts of alcohol can lead to changes in mental function and behavior, and it can cause many physical effects that can be harmful.  °Alcohol intoxication can range in severity from mild to very severe. Various factors can affect the level of intoxication that occurs, such as the person's age, gender, weight, frequency of alcohol consumption, and the presence of other medical conditions (such as diabetes, seizures, or heart conditions). Dangerous levels of alcohol intoxication may occur when people drink large amounts of alcohol in a short period (binge drinking). Alcohol can also be especially dangerous when combined with certain prescription medicines or "recreational" drugs. °SIGNS AND SYMPTOMS °Some common signs and symptoms of mild alcohol intoxication include: °· Loss of coordination. °· Changes in mood and behavior. °· Impaired judgment. °· Slurred speech. °As alcohol intoxication progresses to more severe levels, other signs and symptoms will appear. These may include: °· Vomiting. °· Confusion and impaired memory. °· Slowed breathing. °· Seizures. °· Loss of consciousness. °DIAGNOSIS  °Your health care provider will take a medical history and perform a physical exam. You will be asked about the amount and type of alcohol you have consumed. Blood tests will be done to measure the concentration of alcohol in your blood. In many places, your blood alcohol level must be lower than 80 mg/dL (0.08%) to legally drive. However, many dangerous effects of alcohol can occur at much lower levels.  °TREATMENT  °People with alcohol intoxication often do not require treatment. Most of the effects of alcohol intoxication are temporary, and they go away as the alcohol naturally  leaves the body. Your health care provider will monitor your condition until you are stable enough to go home. Fluids are sometimes given through an IV access tube to help prevent dehydration.  °HOME CARE INSTRUCTIONS °· Do not drive after drinking alcohol. °· Stay hydrated. Drink enough water and fluids to keep your urine clear or pale yellow. Avoid caffeine.   °· Only take over-the-counter or prescription medicines as directed by your health care provider.   °SEEK MEDICAL CARE IF:  °· You have persistent vomiting.   °· You do not feel better after a few days. °· You have frequent alcohol intoxication. Your health care provider can help determine if you should see a substance use treatment counselor. °SEEK IMMEDIATE MEDICAL CARE IF:  °· You become shaky or tremble when you try to stop drinking.   °· You shake uncontrollably (seizure).   °· You throw up (vomit) blood. This may be bright red or may look like black coffee grounds.   °· You have blood in your stool. This may be bright red or may appear as a black, tarry, bad smelling stool.   °· You become lightheaded or faint.   °MAKE SURE YOU:  °· Understand these instructions. °· Will watch your condition. °· Will get help right away if you are not doing well or get worse. °Document Released: 10/20/2004 Document Revised: 09/12/2012 Document Reviewed: 06/15/2012 °[employer]® Patient Information ©2014 [employer]. ° °
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
## 2013-11-25 ENCOUNTER — Encounter (HOSPITAL_COMMUNITY): Payer: Self-pay | Admitting: Emergency Medicine

## 2014-08-11 ENCOUNTER — Ambulatory Visit (INDEPENDENT_AMBULATORY_CARE_PROVIDER_SITE_OTHER): Payer: 59 | Admitting: Family Medicine

## 2014-08-11 ENCOUNTER — Other Ambulatory Visit: Payer: Self-pay

## 2014-08-11 ENCOUNTER — Encounter: Payer: Self-pay | Admitting: Family Medicine

## 2014-08-11 VITALS — BP 106/84 | HR 89 | Temp 99.6°F | Resp 16 | Wt 202.0 lb

## 2014-08-11 DIAGNOSIS — S8392XD Sprain of unspecified site of left knee, subsequent encounter: Secondary | ICD-10-CM | POA: Diagnosis not present

## 2014-08-11 DIAGNOSIS — F419 Anxiety disorder, unspecified: Secondary | ICD-10-CM

## 2014-08-11 DIAGNOSIS — Z872 Personal history of diseases of the skin and subcutaneous tissue: Secondary | ICD-10-CM | POA: Insufficient documentation

## 2014-08-11 DIAGNOSIS — L639 Alopecia areata, unspecified: Secondary | ICD-10-CM | POA: Insufficient documentation

## 2014-08-11 HISTORY — DX: Anxiety disorder, unspecified: F41.9
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
## 2014-08-11 NOTE — Patient Instructions (Addendum)
[facility] as you have intiated.

## 2014-08-12 ENCOUNTER — Telehealth: Payer: Self-pay | Admitting: Family Medicine

## 2014-08-12 NOTE — Telephone Encounter (Signed)
Ok to write note? Please advise. Thanks!  

## 2014-08-12 NOTE — Telephone Encounter (Signed)
Pt called needing note for airlines saying she cannot fly and needed to cancel her flight.  Thanks, Barth Kirkseri

## 2014-08-13 ENCOUNTER — Telehealth: Payer: Self-pay | Admitting: Family Medicine

## 2014-08-13 NOTE — Telephone Encounter (Addendum)
Patient has been advised available for pick up

## 2014-08-13 NOTE — Telephone Encounter (Signed)
Pt husband Brett CanalesSteve is returning call.  CB#[phone]/MW

## 2014-08-13 NOTE — Telephone Encounter (Signed)
Unable to reach patient or emergency contact, patients home phone voicemail box is full, husbands voicemail box has not been set up yet.Will wait for call back.

## 2014-08-13 NOTE — Telephone Encounter (Signed)
Patient has been advised that form is available for pick up

## 2014-08-13 NOTE — Telephone Encounter (Signed)
I have prepared a letter for her. It will be up front for pickup.

## 2014-09-01 ENCOUNTER — Emergency Department (HOSPITAL_COMMUNITY)
Admission: EM | Admit: 2014-09-01 | Discharge: 2014-09-01 | Disposition: A | Discharge status: Home / Self Care | Payer: 59 | Attending: Emergency Medicine | Admitting: Emergency Medicine

## 2014-09-01 ENCOUNTER — Encounter (HOSPITAL_COMMUNITY): Payer: Self-pay | Admitting: Emergency Medicine

## 2014-09-01 DIAGNOSIS — Z87448 Personal history of other diseases of urinary system: Secondary | ICD-10-CM | POA: Insufficient documentation

## 2014-09-01 DIAGNOSIS — M25562 Pain in left knee: Secondary | ICD-10-CM

## 2014-09-01 DIAGNOSIS — Z87828 Personal history of other (healed) physical injury and trauma: Secondary | ICD-10-CM | POA: Insufficient documentation

## 2014-09-01 DIAGNOSIS — Z7982 Long term (current) use of aspirin: Secondary | ICD-10-CM | POA: Insufficient documentation

## 2014-09-01 DIAGNOSIS — G8918 Other acute postprocedural pain: Secondary | ICD-10-CM | POA: Diagnosis present

## 2014-09-01 DIAGNOSIS — Z87891 Personal history of nicotine dependence: Secondary | ICD-10-CM | POA: Diagnosis not present

## 2014-09-01 DIAGNOSIS — F419 Anxiety disorder, unspecified: Secondary | ICD-10-CM | POA: Insufficient documentation

## 2014-09-01 DIAGNOSIS — Z79899 Other long term (current) drug therapy: Secondary | ICD-10-CM | POA: Diagnosis not present

## 2014-09-01 DIAGNOSIS — Z872 Personal history of diseases of the skin and subcutaneous tissue: Secondary | ICD-10-CM | POA: Diagnosis not present

## 2014-09-01 MED ORDER — MORPHINE SULFATE 4 MG/ML IJ SOLN
4.0000 mg | Freq: Once | INTRAMUSCULAR | Status: AC
  Administered 2014-09-01: 4 mg via INTRAVENOUS
  Filled 2014-09-01: qty 1

## 2014-09-01 MED ORDER — METHOCARBAMOL 500 MG PO TABS: 500.0000 mg | ORAL_TABLET | Freq: Two times a day (BID) | ORAL | Status: DC

## 2014-09-01 NOTE — ED Notes (Signed)
Bed: Ann [name] [facility] Expected date:  Expected time:  Means of arrival:  Comments: EMS/58F/s/p knee surgery/knee pain

## 2014-09-01 NOTE — ED Notes (Signed)
Bed: ZO10 Expected date:  Expected time:  Means of arrival:  Comments: Lauralyn Primes

## 2014-09-01 NOTE — ED Notes (Signed)
Patient here with complaints of left knee pain. Surgery last Thursday for ACL repair.. 400 mcg Fent given in route. 1/10 pain.
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
## 2015-01-06 LAB — HM PAP SMEAR

## 2015-03-09 ENCOUNTER — Ambulatory Visit (INDEPENDENT_AMBULATORY_CARE_PROVIDER_SITE_OTHER): Payer: BLUE CROSS/BLUE SHIELD | Admitting: Family Medicine

## 2015-03-09 ENCOUNTER — Encounter: Payer: Self-pay | Admitting: Family Medicine

## 2015-03-09 VITALS — BP 102/60 | HR 80 | Temp 99.1°F | Resp 16 | Ht 64.0 in | Wt 202.0 lb

## 2015-03-09 DIAGNOSIS — M722 Plantar fascial fibromatosis: Secondary | ICD-10-CM | POA: Diagnosis not present

## 2015-03-09 NOTE — Patient Instructions (Signed)
Discussed scheduling of ibuprofen or Aleve. Get a gel shoe or heel insert. Minimize high impact activity. Call me for podiatry referral if not improving in 2 weeks.

## 2015-03-09 NOTE — Progress Notes (Signed)
Subjective:     Patient ID: Ann [name], female   DOB: 11-Jan-1986, 30 y.o.   MRN: [id number]  HPI  Chief Complaint  Patient presents with  . Foot Pain    Patient reports that she has had pain in her right heel X 1 month. Denies injury. Takes Ibuprofen with no relief.   States her heel hurts the most when she puts weight on it first thing in the AM. Works out regularly walking several x week as well as weight training. 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 She has bought new athletic shoes which have ameliorated the pain to a modest degree.   Review of Systems     Objective:   Physical Exam  Constitutional: She appears well-developed and well-nourished.  Musculoskeletal:  Right ankle ligaments stable. She is moderately tender over her mid plantar heel. DF/PF 5/5.       Assessment:    1. Plantar fasciitis, right    Plan:    Discussed conservative treatment. May call for podiatry referral if not improving.

## 2016-03-18 ENCOUNTER — Encounter: Payer: Self-pay | Admitting: Family Medicine

## 2016-03-18 ENCOUNTER — Ambulatory Visit (INDEPENDENT_AMBULATORY_CARE_PROVIDER_SITE_OTHER): Payer: BLUE CROSS/BLUE SHIELD | Admitting: Family Medicine

## 2016-03-18 VITALS — BP 100/54 | HR 94 | Temp 98.9°F | Resp 16 | Wt 193.6 lb

## 2016-03-18 DIAGNOSIS — R197 Diarrhea, unspecified: Secondary | ICD-10-CM | POA: Diagnosis not present

## 2016-03-18 MED ORDER — DICYCLOMINE HCL 20 MG PO TABS: 20.0000 mg | ORAL_TABLET | Freq: Four times a day (QID) | ORAL | 0 refills | Status: DC

## 2016-03-18 NOTE — Progress Notes (Signed)
Subjective:     Patient ID: Di M HColletta Marylandines, female   DOB: [birth year], 31 y.o.   MRN: [id number]  HPI  Chief Complaint  Patient presents with  . Diarrhea    patient comes in office today with concerns of diarrhea for the past 7 days. Patient reports that she has had a decreased appetite, fatigue and headache associated. Patient has been taking otc Pepto chewables and true biotic tablets.   Reports loose to soft stools several x day. Accompanied by cramping but no rectal bleeding, fever, or chills: "I don't feel sick." Reports she started classes at [employer] a week or two prior to symptoms. Reports a hectic schedule with two young children aged 4 & 5. No precedent abx use or travel. + well water but no family members have similar sx.   Review of Systems     Objective:   Physical Exam  Constitutional: She appears well-developed and well-nourished. No distress.  Pulmonary/Chest: Breath sounds normal.  Abdominal: Soft. There is tenderness (mild epigastric/right LQ ). There is no guarding.       Assessment:    1. Diarrhea, unspecified type: suspect stress induced - dicyclomine (BENTYL) 20 MG tablet; Take 1 tablet (20 mg total) by mouth every 6 (six) hours. As needed for diarrhea/cramps  Dispense: 28 tablet; Refill: 0    Plan:    Call if not improving for consideration of stool tests and/or SSRI trial.

## 2016-03-18 NOTE — Patient Instructions (Addendum)
Let me know if medication did not help or you wish refills.

## 2016-03-28 ENCOUNTER — Encounter: Payer: Self-pay | Admitting: Family Medicine

## 2016-03-28 ENCOUNTER — Ambulatory Visit (INDEPENDENT_AMBULATORY_CARE_PROVIDER_SITE_OTHER): Payer: BLUE CROSS/BLUE SHIELD | Admitting: Family Medicine

## 2016-03-28 VITALS — BP 122/76 | HR 80 | Temp 98.6°F | Resp 16 | Wt 194.0 lb

## 2016-03-28 DIAGNOSIS — R197 Diarrhea, unspecified: Secondary | ICD-10-CM | POA: Diagnosis not present

## 2016-03-28 DIAGNOSIS — R109 Unspecified abdominal pain: Secondary | ICD-10-CM | POA: Diagnosis not present

## 2016-03-28 NOTE — Patient Instructions (Addendum)
Try the FODMAP food list. We will call you with the lab results. Phone follow up in two weeks.

## 2016-03-28 NOTE — Progress Notes (Addendum)
Subjective:     Patient ID: Ann [name], female   DOB: November 15, 1985, 31 y.o.   MRN: [id number]  HPI  Chief Complaint  Patient presents with  . Diarrhea    Patient returns back to office today for follow up, last office visit was 03/18/16 patient was started on Bentyl 20mg . Patient reports fair compliance on medication but states that she is still havinhg loose stools. Onset of new symptoms occured with sharp upper abdominal pain, left sided flank pain radiating to back, being bloated and constantly hungry.   Reports she takes Bentyl 3 x day but still having loose stool 3-4 x day (onset on or about 03/11/16) Reports an increase in upper abdominal cramping lasting up to 3 minutes. Rare ETOH use. Denies blood in her stool.   Review of Systems     Objective:   Physical Exam  Constitutional: She appears well-developed and well-nourished.  Abdominal: Soft. Bowel sounds are normal. There is tenderness (mild in epigastric area). There is no guarding.       Assessment:    1. Diarrhea, unspecified type - CBC with Differential/Platelet - Comprehensive metabolic panel - Lipase - Stool C-Diff Toxin Assay  2. Abdominal cramping - CBC with Differential/Platelet - Comprehensive metabolic panel - Lipase - Stool C-Diff Toxin Assay    Plan:    Will try FODMAP food list pending labs. Suggested phone f/u in two weeks. Consider G.I. Referral.

## 2016-03-29 ENCOUNTER — Telehealth: Payer: Self-pay

## 2016-03-29 LAB — CBC WITH DIFFERENTIAL/PLATELET
Basophils Absolute: 0.1 10*3/uL (ref 0.0–0.2)
Basos: 1 %
EOS (ABSOLUTE): 0.2 10*3/uL (ref 0.0–0.4)
Eos: 2 %
Hematocrit: 41.6 % (ref 34.0–46.6)
Hemoglobin: 13.8 g/dL (ref 11.1–15.9)
Immature Grans (Abs): 0 10*3/uL (ref 0.0–0.1)
Immature Granulocytes: 0 %
Lymphocytes Absolute: 3 10*3/uL (ref 0.7–3.1)
Lymphs: 31 %
MCH: 28.1 pg (ref 26.6–33.0)
MCHC: 33.2 g/dL (ref 31.5–35.7)
MCV: 85 fL (ref 79–97)
Monocytes Absolute: 0.8 10*3/uL (ref 0.1–0.9)
Monocytes: 8 %
Neutrophils Absolute: 5.6 10*3/uL (ref 1.4–7.0)
Neutrophils: 58 %
Platelets: 369 10*3/uL (ref 150–379)
RBC: 4.91 x10E6/uL (ref 3.77–5.28)
RDW: 12.5 % (ref 12.3–15.4)
WBC: 9.6 10*3/uL (ref 3.4–10.8)

## 2016-03-29 LAB — COMPREHENSIVE METABOLIC PANEL
ALT: 14 IU/L (ref 0–32)
AST: 15 IU/L (ref 0–40)
Albumin/Globulin Ratio: 2.4 — ABNORMAL HIGH (ref 1.2–2.2)
Albumin: 5.1 g/dL (ref 3.5–5.5)
Alkaline Phosphatase: 65 IU/L (ref 39–117)
BUN/Creatinine Ratio: 24 — ABNORMAL HIGH (ref 9–23)
BUN: 21 mg/dL — ABNORMAL HIGH (ref 6–20)
Bilirubin Total: 0.2 mg/dL (ref 0.0–1.2)
CO2: 23 mmol/L (ref 18–29)
Calcium: 9.7 mg/dL (ref 8.7–10.2)
Chloride: 101 mmol/L (ref 96–106)
Creatinine, Ser: 0.87 mg/dL (ref 0.57–1.00)
GFR calc Af Amer: 103 mL/min/{1.73_m2} (ref >59)
GFR calc non Af Amer: 90 mL/min/{1.73_m2} (ref >59)
Globulin, Total: 2.1 g/dL (ref 1.5–4.5)
Glucose: 82 mg/dL (ref 65–99)
Potassium: 5.1 mmol/L (ref 3.5–5.2)
Sodium: 141 mmol/L (ref 134–144)
Total Protein: 7.2 g/dL (ref 6.0–8.5)

## 2016-03-29 LAB — LIPASE: Lipase: 51 U/L (ref 14–72)

## 2016-03-29 NOTE — Telephone Encounter (Signed)
-----   Message from Anola Gurneyobert Chauvin, [place] sent at 03/29/2016  7:36 AM EST ----- Labs good: no anemia or white count elevation. Liver and pancreas ok. Await results of stool test.

## 2016-03-29 NOTE — Telephone Encounter (Signed)
[id number] 

## 2016-03-30 NOTE — Telephone Encounter (Signed)
[id number] 

## 2016-03-31 NOTE — Telephone Encounter (Signed)
[id number] 

## 2016-04-01 NOTE — Telephone Encounter (Signed)
Attempted to contact patient but recipient that answered phone stated I had wrong number, will mail letter to home. KW

## 2016-05-10 ENCOUNTER — Other Ambulatory Visit: Payer: Self-pay | Admitting: Family Medicine

## 2016-05-10 ENCOUNTER — Encounter: Payer: Self-pay | Admitting: Family Medicine

## 2016-05-10 ENCOUNTER — Ambulatory Visit (INDEPENDENT_AMBULATORY_CARE_PROVIDER_SITE_OTHER): Payer: BLUE CROSS/BLUE SHIELD | Admitting: Family Medicine

## 2016-05-10 VITALS — BP 122/84 | HR 83 | Temp 98.8°F | Resp 16 | Wt 193.0 lb

## 2016-05-10 DIAGNOSIS — J0100 Acute maxillary sinusitis, unspecified: Secondary | ICD-10-CM

## 2016-05-10 MED ORDER — AMOXICILLIN-POT CLAVULANATE 875-125 MG PO TABS: 1.0000 | ORAL_TABLET | Freq: Two times a day (BID) | ORAL | 0 refills | Status: DC

## 2016-05-10 MED ORDER — FLUCONAZOLE 150 MG PO TABS: 150.0000 mg | ORAL_TABLET | Freq: Once | ORAL | 0 refills | Status: AC

## 2016-05-10 NOTE — Progress Notes (Signed)
Subjective:     Patient ID: Ann [name], female   DOB: Sep 09, 1985, 31 y.o.   MRN: [id number]  HPI  Chief Complaint  Patient presents with  . Cough    Patient comes into office with concerns of cough and congestion for over a week. Patient states that cough is productive and now haivng pain on left side of chest when coughing, associated with cough patient reports shortness of breath and sinus headaches. Patient has been taking otc Claritin, Ibuprofen, Mucinex and Mucinex DM.  Patient reports increased sinus pressure, purulent sinus drainage, post nasal drainage and accompanying cough (day # 9 since onset).   Review of Systems     Objective:   Physical Exam  Constitutional: She appears well-developed and well-nourished. No distress.  Ears: T.M's intact without inflammation Sinuses: moderate maxillary sinus tenderness Throat: no tonsillar enlargement or exudate Neck: no cervical adenopathy Lungs: clear     Assessment:    1. Acute maxillary sinusitis, recurrence not specified - amoxicillin-clavulanate (AUGMENTIN) 875-125 MG tablet; Take 1 tablet by mouth 2 (two) times daily.  Dispense: 20 tablet; Refill: 0 - fluconazole (DIFLUCAN) 150 MG tablet; Take 1 tablet (150 mg total) by mouth once.  Dispense: 1 tablet; Refill: 0    Plan:    Discussed use of Mucinex D and Delsym.

## 2016-05-10 NOTE — Patient Instructions (Signed)
Discussed use of Mucinex D and Delsym for cough. 

## 2016-07-25 ENCOUNTER — Encounter: Payer: Self-pay | Admitting: Family Medicine

## 2016-07-25 ENCOUNTER — Ambulatory Visit (INDEPENDENT_AMBULATORY_CARE_PROVIDER_SITE_OTHER): Payer: BLUE CROSS/BLUE SHIELD | Admitting: Family Medicine

## 2016-07-25 VITALS — BP 130/80 | HR 85 | Temp 99.0°F | Resp 16 | Wt 187.0 lb

## 2016-07-25 DIAGNOSIS — W57XXXA Bitten or stung by nonvenomous insect and other nonvenomous arthropods, initial encounter: Secondary | ICD-10-CM | POA: Diagnosis not present

## 2016-07-25 DIAGNOSIS — L539 Erythematous condition, unspecified: Secondary | ICD-10-CM

## 2016-07-25 MED ORDER — PREDNISONE 10 MG PO TABS: ORAL_TABLET | ORAL | 0 refills | Status: DC

## 2016-07-25 MED ORDER — FLUOCINONIDE 0.05 % EX CREA: 1.0000 "application " | TOPICAL_CREAM | Freq: Three times a day (TID) | CUTANEOUS | 0 refills | Status: DC

## 2016-07-25 NOTE — Patient Instructions (Signed)
Let me know if your are not improving. Continue Claritin and oral Benadryl at night for itching.

## 2016-07-25 NOTE — Progress Notes (Signed)
Subjective:     Patient ID: Ann [name], female   DOB: 29-May-1985, 31 y.o.   MRN: [id number]  HPI  Chief Complaint  Patient presents with  . Rash    Patient comes in office today with complaints of body rash for the past 6days. Paitent states that [place] and it was initially just bumps around her right ankle, but has not seemed to spread all over her body. Patient describes rash as red, raised,itchy and painful. Patient has been applying ice and hydrocortisone cream  . Neck Pain    Patient complains of bilateral neck pain that she desribes as sensitive for the past 2 days or more, patient denies sore throat.   States she is itching around her neck, no respiratory sx. States while at the beach she also [place]. Was exposed to may mosquitoes and other flying insects.   Review of Systems     Objective:   Physical Exam  Constitutional: She appears well-developed and well-nourished. No distress.  Skin:  Several papules in her right lower abdomen and right inner thigh. One near her right axillary area. No vesicles, crusting, or drainage.       Assessment:    1. Insect bite, initial encounter - fluocinonide cream (LIDEX) 0.05 %; Apply 1 application topically 3 (three) times daily. To insect bites  Dispense: 15 g; Refill: 0 - predniSONE (DELTASONE) 10 MG tablet; Taper daily as follows: 6 pills, 5, 4, 3, 2, 1.  Dispense: 21 tablet; Refill: 0    Plan:    Continue Claritin and add Benadryl at night. Add oral prednisone if rash not improving with topical steroid cream.

## 2016-08-08 ENCOUNTER — Ambulatory Visit: Payer: BLUE CROSS/BLUE SHIELD | Admitting: Family Medicine

## 2016-10-03 ENCOUNTER — Encounter: Payer: Self-pay | Admitting: Family Medicine

## 2016-10-03 ENCOUNTER — Ambulatory Visit (INDEPENDENT_AMBULATORY_CARE_PROVIDER_SITE_OTHER): Payer: BLUE CROSS/BLUE SHIELD | Admitting: Family Medicine

## 2016-10-03 VITALS — BP 104/60 | HR 68 | Temp 98.6°F | Resp 16 | Wt 186.0 lb

## 2016-10-03 DIAGNOSIS — T63481A Toxic effect of venom of other arthropod, accidental (unintentional), initial encounter: Secondary | ICD-10-CM

## 2016-10-03 DIAGNOSIS — W57XXXA Bitten or stung by nonvenomous insect and other nonvenomous arthropods, initial encounter: Secondary | ICD-10-CM

## 2016-10-03 MED ORDER — HYDROXYZINE HCL 25 MG PO TABS: ORAL_TABLET | ORAL | 1 refills | Status: DC

## 2016-10-03 MED ORDER — TRIAMCINOLONE ACETONIDE 0.1 % EX CREA: 1.0000 "application " | TOPICAL_CREAM | Freq: Three times a day (TID) | CUTANEOUS | 0 refills | Status: DC

## 2016-10-03 NOTE — Progress Notes (Signed)
Subjective:     Patient ID: Colletta MarylandKrystin M Dena, female   DOB: [birth year], 31 y.o.   MRN: [id number]  HPI  Chief Complaint  Patient presents with  . Insect Bite    Pt reports coming into contact with an ant hill Saturday evening.    States she has started topical hydrocortisone cream, topical Benadryl oral prednisone taper, hydroxyzine, and oral Benadryl, Continues to itch and sting. Unable to characterize the ants as it was dark.   Review of Systems     Objective:   Physical Exam  Constitutional: She appears well-developed and well-nourished. No distress.  Skin:  Multiple papular insect bites on her lower extremities and forearms.       Assessment:    1. Insect bites and stings, accidental or unintentional, initial encounter - triamcinolone cream (KENALOG) 0.1 %; Apply 1 application topically 3 (three) times daily.  Dispense: 80 g; Refill: 0 - hydrOXYzine (ATARAX/VISTARIL) 25 MG tablet; One or two every 6 hours as needed for itching  Dispense: 30 tablet; Refill: 1    Plan:    Start bathtub soaks  (Oatmeal, Aveeno, or epsom salts) and complete prednisone taper.

## 2016-10-03 NOTE — Patient Instructions (Signed)
Continue prednisone taper. Try Aveeno or oatmeal or Epsom Salt baths.

## 2016-12-29 ENCOUNTER — Encounter: Payer: Self-pay | Admitting: Family Medicine

## 2016-12-29 ENCOUNTER — Ambulatory Visit (INDEPENDENT_AMBULATORY_CARE_PROVIDER_SITE_OTHER): Payer: BLUE CROSS/BLUE SHIELD | Admitting: Family Medicine

## 2016-12-29 VITALS — BP 120/90 | HR 82 | Temp 98.2°F | Resp 16 | Wt 188.6 lb

## 2016-12-29 DIAGNOSIS — B349 Viral infection, unspecified: Secondary | ICD-10-CM | POA: Diagnosis not present

## 2016-12-29 LAB — POCT INFLUENZA A/B
Influenza A, POC: NEGATIVE
Influenza B, POC: NEGATIVE

## 2016-12-29 MED ORDER — HYDROCODONE-HOMATROPINE 5-1.5 MG/5ML PO SYRP: ORAL_SOLUTION | ORAL | 0 refills | Status: DC

## 2016-12-29 NOTE — Patient Instructions (Signed)
Discussed use of Mucinex D for congestion and Delsym for cough. Let me know if you are not improving over the next few days.

## 2016-12-29 NOTE — Progress Notes (Signed)
Subjective:     Patient ID: Ann [name], female   DOB: 04-16-1985, 31 y.o.   MRN: [id number] Chief Complaint  Patient presents with  . Generalized Body Aches    Patient comes in office today with complaints of body ache, head pain, fever high of 99.9, diarrhea, cough and runny nose for the past 4 days. Patient reports taking otc Theraflu, Alka Seltzer DM and Ibuprofen.    HPI States her 25 and 31 year old are not sick. Currently works for [employer].  Review of Systems     Objective:   Physical Exam  Constitutional: She appears well-developed and well-nourished. No distress.  Ears: T.M's intact without inflammation Throat: tonsils absent Neck: no cervical adenopathy Lungs: clear     Assessment:    1. Acute viral syndrome - POCT Influenza A/B - HYDROcodone-homatropine (HYCODAN) 5-1.5 MG/5ML syrup; 5 ml 4-6 hours as needed for cough  Dispense: 120 mL; Refill: 0    Plan:    Discussed use of Mucinex D and Delsym. Work excuse for 12/6-12/7/18.

## 2017-04-01 ENCOUNTER — Encounter: Payer: Self-pay | Admitting: Family Medicine

## 2017-04-01 ENCOUNTER — Ambulatory Visit (INDEPENDENT_AMBULATORY_CARE_PROVIDER_SITE_OTHER): Payer: Managed Care, Other (non HMO) | Admitting: Family Medicine

## 2017-04-01 VITALS — BP 122/80 | HR 93 | Temp 99.0°F | Resp 18

## 2017-04-01 DIAGNOSIS — J101 Influenza due to other identified influenza virus with other respiratory manifestations: Secondary | ICD-10-CM

## 2017-04-01 DIAGNOSIS — R05 Cough: Secondary | ICD-10-CM | POA: Diagnosis not present

## 2017-04-01 DIAGNOSIS — R059 Cough, unspecified: Secondary | ICD-10-CM

## 2017-04-01 LAB — POCT INFLUENZA A/B
Influenza A, POC: POSITIVE — AB
Influenza B, POC: NEGATIVE

## 2017-04-01 MED ORDER — HYDROCODONE-HOMATROPINE 5-1.5 MG/5ML PO SYRP: 5.0000 mL | ORAL_SOLUTION | Freq: Three times a day (TID) | ORAL | 0 refills | Status: DC | PRN

## 2017-04-01 MED ORDER — OSELTAMIVIR PHOSPHATE 75 MG PO CAPS: 75.0000 mg | ORAL_CAPSULE | Freq: Two times a day (BID) | ORAL | 0 refills | Status: DC

## 2017-04-01 NOTE — Patient Instructions (Signed)
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
## 2017-06-26 ENCOUNTER — Ambulatory Visit (INDEPENDENT_AMBULATORY_CARE_PROVIDER_SITE_OTHER): Payer: Managed Care, Other (non HMO) | Admitting: Family Medicine

## 2017-06-26 ENCOUNTER — Encounter: Payer: Self-pay | Admitting: Family Medicine

## 2017-06-26 VITALS — BP 108/70 | HR 107 | Temp 98.8°F | Resp 16 | Wt 185.6 lb

## 2017-06-26 DIAGNOSIS — M222X2 Patellofemoral disorders, left knee: Secondary | ICD-10-CM

## 2017-06-26 NOTE — Patient Instructions (Addendum)
Start ibuprofen 800 mg. 3 x day with food. Ice your knee for 20 minutes at the end of your work day. Minimize bending, squatting and stairs. Let me know if not improving for referral to orthopedics, Dr. Lyman BishopSilver.

## 2017-06-26 NOTE — Progress Notes (Signed)
  Subjective:     Patient ID: Ann [name], female   DOB: September 12, 1985, 32 y.o.   MRN: [id number] Chief Complaint  Patient presents with  . Knee Pain    Patient comes in office today with complaints of left knee pain that began yesterday. Patient reports that pain is located right under the patella which she describes as a pinching feeling. Patient states tha tshe has had difficulty with climbing stairs and pain when driving.    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 Prior left MCL/meniscus repair in 2016.  Review of Systems     Objective:   Physical Exam  Constitutional: She appears well-developed and well-nourished. No distress.  Musculoskeletal:  Left KF/KE 5/5. FROM with mild crepitus noted. Ligaments stable. No effusion or pain on patellar palpation.       Assessment:    1. Patellofemoral syndrome of left knee    Plan:    Discussed scheduling ibuprofen and icing her knee at the end of the work day. Modify your work duties as best as you can. Call for referral if not improving.

## 2017-07-08 ENCOUNTER — Encounter: Payer: Self-pay | Admitting: Family Medicine

## 2017-07-08 ENCOUNTER — Ambulatory Visit (INDEPENDENT_AMBULATORY_CARE_PROVIDER_SITE_OTHER): Payer: Managed Care, Other (non HMO) | Admitting: Family Medicine

## 2017-07-08 VITALS — BP 118/76 | HR 76 | Temp 98.9°F | Resp 16 | Wt 184.0 lb

## 2017-07-08 DIAGNOSIS — L639 Alopecia areata, unspecified: Secondary | ICD-10-CM | POA: Diagnosis not present

## 2017-07-08 DIAGNOSIS — B029 Zoster without complications: Secondary | ICD-10-CM | POA: Diagnosis not present

## 2017-07-08 MED ORDER — VALACYCLOVIR HCL 1 G PO TABS: 1000.0000 mg | ORAL_TABLET | Freq: Three times a day (TID) | ORAL | 0 refills | Status: AC

## 2017-07-08 NOTE — Progress Notes (Signed)
       Patient: Ann MarylandKrystin M Lapinsky Female    DOB: [birth year]   32 y.o.   MRN: [id number] Visit Date: 07/08/2017  Today's Provider: Mila Merryonald Fisher, MD   Chief Complaint  Patient presents with  . Rash   Subjective:    HPI Patient comes in today c/o 5 blisters on her left side. She reports that it is very painful and tender. She reports pain in area had been there for 5-6 days, but blisters just popped up 2 days ago.  She does report history of shingles on her left shoulder when she was 19.    She also requests referral to an immune system specialist. She has history of alopecia and states she was told it is an autoimmune condition. [facility] few years ago and records states she has biopsy proven alopecia areata.     Allergies  Allergen Reactions  . Darvocet [Propoxyphene N-Acetaminophen] Nausea And Vomiting  . Latex   . Meperidine Nausea And Vomiting    Syncope     Current Outpatient Medications:  .  B COMPLEX VITAMINS PO, Take 1 tablet by mouth daily. Reported on 03/09/2015, Disp: , Rfl:  .  levonorgestrel (MIRENA, 52 MG,) 20 MCG/24HR IUD, Mirena 20 mcg/24 hours (5 yrs) 52 mg intrauterine device  Take 1 device by intrauterine route., Disp: , Rfl:  .  MULTIPLE VITAMIN PO, Take 1 tablet by mouth daily. , Disp: , Rfl:  .  Probiotic Product (PROBIOTIC DAILY PO), Take by mouth., Disp: , Rfl:   Review of Systems  Constitutional: Negative for activity change, appetite change, chills, diaphoresis, fatigue, fever and unexpected weight change.  Musculoskeletal: Positive for myalgias.  Skin: Positive for rash.    Social History   Tobacco Use  . Smoking status: Former Smoker    Last attempt to quit: 08/28/2009    Years since quitting: 7.8  . Smokeless tobacco: Never Used  Substance Use Topics  . Alcohol use: Yes   Objective:   BP 118/76 (BP Location: Right Arm, Patient Position: Sitting, Cuff Size: Normal)   Pulse 76   Temp 98.9 F (37.2 C)   Resp 16    Wt 184 lb (83.5 kg)   SpO2 99%   BMI 31.58 kg/m  Vitals:   07/08/17 1011  BP: 118/76  Pulse: 76  Resp: 16  Temp: 98.9 F (37.2 C)  SpO2: 99%  Weight: 184 lb (83.5 kg)     Physical Exam  General appearance: alert, well developed, well nourished, cooperative and in no distress Head: Normocephalic, without obvious abnormality, atraumatic Respiratory: Respirations even and unlabored, normal respiratory rate Extremities: No gross deformities Skin: Small patch of irritated papular lesion left lateral hip in dermatomal distribution.      Assessment & Plan:     1. Herpes zoster without complication  - valACYclovir (VALTREX) 1000 MG tablet; Take 1 tablet (1,000 mg total) by mouth every 8 (eight) hours for 7 days.  Dispense: 21 tablet; Refill: 0  2. Alopecia areata        Mila Merryonald Fisher, MD  [facility]

## 2018-03-29 ENCOUNTER — Ambulatory Visit (INDEPENDENT_AMBULATORY_CARE_PROVIDER_SITE_OTHER): Payer: Managed Care, Other (non HMO) | Admitting: Family Medicine

## 2018-03-29 ENCOUNTER — Encounter: Payer: Self-pay | Admitting: Family Medicine

## 2018-03-29 VITALS — BP 117/79 | HR 83 | Temp 98.7°F | Wt 194.6 lb

## 2018-03-29 DIAGNOSIS — M62838 Other muscle spasm: Secondary | ICD-10-CM

## 2018-03-29 DIAGNOSIS — H66001 Acute suppurative otitis media without spontaneous rupture of ear drum, right ear: Secondary | ICD-10-CM | POA: Diagnosis not present

## 2018-03-29 MED ORDER — FLUCONAZOLE 150 MG PO TABS: 150.0000 mg | ORAL_TABLET | Freq: Once | ORAL | 0 refills | Status: AC

## 2018-03-29 MED ORDER — CYCLOBENZAPRINE HCL 5 MG PO TABS: 5.0000 mg | ORAL_TABLET | Freq: Three times a day (TID) | ORAL | 1 refills | Status: DC | PRN

## 2018-03-29 MED ORDER — AMOXICILLIN-POT CLAVULANATE 875-125 MG PO TABS: 1.0000 | ORAL_TABLET | Freq: Two times a day (BID) | ORAL | 0 refills | Status: DC

## 2018-03-29 NOTE — Progress Notes (Signed)
Patient: Ann [name] Female    DOB: [birth year]   32 y.o.   MRN: [id number] Visit Date: 03/29/2018  Today's Provider: Shirlee Latch, MD   Chief Complaint  Patient presents with  . Shoulder Pain  . Ear Pain    right ear   Subjective:     Shoulder Pain   The pain is present in the right shoulder (radiating down to elbow). This is a new problem. The current episode started 1 to 4 weeks ago. There has been no history of extremity trauma. The problem occurs constantly. The problem has been gradually worsening. The quality of the pain is described as dull and aching. The pain is at a severity of 3/10. The symptoms are aggravated by cold (bending arm). She has tried heat, NSAIDS and movement for the symptoms. The treatment provided mild relief.   Worse with overuse.   NSAIDs help temporarily No injury or trauma   Right ear pain Pt complains of right ear pain that radiates into neck x4 days. Also complains of ear fullness. Seems to be getting worse.  No fevers or drainage. +nasal congestion.   Allergies  Allergen Reactions  . Darvocet [Propoxyphene N-Acetaminophen] Nausea And Vomiting  . Latex   . Meperidine Nausea And Vomiting    Syncope     Current Outpatient Medications:  .  levonorgestrel (MIRENA, 52 MG,) 20 MCG/24HR IUD, Mirena 20 mcg/24 hours (5 yrs) 52 mg intrauterine device  Take 1 device by intrauterine route., Disp: , Rfl:  .  loratadine (CLARITIN) 10 MG tablet, Take 10 mg by mouth daily., Disp: , Rfl:  .  MULTIPLE VITAMIN PO, Take 1 tablet by mouth daily. , Disp: , Rfl:  .  Probiotic Product (PROBIOTIC DAILY PO), Take by mouth., Disp: , Rfl:  .  amoxicillin-clavulanate (AUGMENTIN) 875-125 MG tablet, Take 1 tablet by mouth 2 (two) times daily for 7 days., Disp: 14 tablet, Rfl: 0 .  B COMPLEX VITAMINS PO, Take 1 tablet by mouth daily. Reported on 03/09/2015, Disp: , Rfl:  .  cyclobenzaprine (FLEXERIL) 5 MG tablet, Take 1 tablet (5 mg total) by mouth 3 (three)  times daily as needed for muscle spasms., Disp: 30 tablet, Rfl: 1 .  fluconazole (DIFLUCAN) 150 MG tablet, Take 1 tablet (150 mg total) by mouth once for 1 dose., Disp: 1 tablet, Rfl: 0  Review of Systems  Constitutional: Negative.   HENT: Positive for ear pain (right ear). Negative for congestion, dental problem, drooling, ear discharge, facial swelling, hearing loss, mouth sores, nosebleeds, postnasal drip, rhinorrhea, sinus pressure, sinus pain, sneezing, sore throat, tinnitus, trouble swallowing and voice change.   Eyes: Negative.   Respiratory: Negative.   Cardiovascular: Negative.   Gastrointestinal: Negative.   Genitourinary: Negative.   Musculoskeletal: Positive for arthralgias and myalgias. Negative for gait problem and joint swelling.  Neurological: Negative.   Psychiatric/Behavioral: Negative.     Social History   Tobacco Use  . Smoking status: Former Smoker    Last attempt to quit: 08/28/2009    Years since quitting: 8.5  . Smokeless tobacco: Never Used  Substance Use Topics  . Alcohol use: Yes      Objective:   BP 117/79 (BP Location: Left Arm, Patient Position: Sitting, Cuff Size: Normal)   Pulse 83   Temp 98.7 F (37.1 C) (Oral)   Wt 194 lb 9.6 oz (88.3 kg)   SpO2 98%   BMI 33.40 kg/m  Vitals:   03/29/18 1453  BP:  117/79  Pulse: 83  Temp: 98.7 F (37.1 C)  TempSrc: Oral  SpO2: 98%  Weight: 194 lb 9.6 oz (88.3 kg)     Physical Exam Vitals signs reviewed.  Constitutional:      General: She is not in acute distress.    Appearance: Normal appearance. She is not diaphoretic.  HENT:     Head: Normocephalic and atraumatic.     Right Ear: Ear canal and external ear normal. Tympanic membrane is erythematous and bulging.     Left Ear: Tympanic membrane, ear canal and external ear normal.     Nose: Nose normal.     Mouth/Throat:     Mouth: Mucous membranes are moist.     Pharynx: Oropharynx is clear. No oropharyngeal exudate or posterior oropharyngeal  erythema.  Eyes:     General: No scleral icterus.    Conjunctiva/sclera: Conjunctivae normal.     Pupils: Pupils are equal, round, and reactive to light.  Neck:     Musculoskeletal: Neck supple.  Cardiovascular:     Rate and Rhythm: Normal rate and regular rhythm.     Pulses: Normal pulses.     Heart sounds: Normal heart sounds. No murmur.  Pulmonary:     Effort: Pulmonary effort is normal. No respiratory distress.     Breath sounds: Normal breath sounds. No wheezing or rhonchi.  Musculoskeletal:     Right lower leg: No edema.     Left lower leg: No edema.     Comments: R Shoulder: Inspection reveals no abnormalities, atrophy or asymmetry. Palpation is normal with no tenderness over AC joint or bicipital groove. ROM is full in all planes. Rotator cuff strength normal throughout. Tightness and TTP of R trapezius muscle.  Lymphadenopathy:     Cervical: No cervical adenopathy.  Skin:    General: Skin is warm and dry.     Capillary Refill: Capillary refill takes less than 2 seconds.     Findings: No rash.  Neurological:     Mental Status: She is alert and oriented to person, place, and time. Mental status is at baseline.  Psychiatric:        Mood and Affect: Mood normal.        Behavior: Behavior normal.        Assessment & Plan   1. Non-recurrent acute suppurative otitis media of right ear without spontaneous rupture of tympanic membrane - symptoms and exam c/w R AOM  - no evidence of strep pharyngitis, CAP, AOM, bacterial sinusitis, or other infection - will treat with Augmentin x7d - discussed symptomatic management and return precautions   2. Trapezius muscle spasm - no shoulder joint pathology - pain is from trapezius spasm - flexeril prn - discussed HEP - discussed heat and NSAIDs prn    Meds ordered this encounter  Medications  . amoxicillin-clavulanate (AUGMENTIN) 875-125 MG tablet    Sig: Take 1 tablet by mouth 2 (two) times daily for 7 days.    Dispense:   14 tablet    Refill:  0  . fluconazole (DIFLUCAN) 150 MG tablet    Sig: Take 1 tablet (150 mg total) by mouth once for 1 dose.    Dispense:  1 tablet    Refill:  0  . cyclobenzaprine (FLEXERIL) 5 MG tablet    Sig: Take 1 tablet (5 mg total) by mouth 3 (three) times daily as needed for muscle spasms.    Dispense:  30 tablet    Refill:  1  Return if symptoms worsen or fail to improve.   The entirety of the information documented in the History of Present Illness, Review of Systems and Physical Exam were personally obtained by me. Portions of this information were initially documented by [name] and Benjie Karvonen, CMA and reviewed by me for thoroughness and accuracy.    Erasmo Downer, MD, MPH [facility] 03/29/2018 4:42 PM

## 2018-03-29 NOTE — Patient Instructions (Signed)
Shoulder Exercises Ask your health care provider which exercises are safe for you. Do exercises exactly as told by your health care provider and adjust them as directed. It is normal to feel mild stretching, pulling, tightness, or discomfort as you do these exercises, but you should stop right away if you feel sudden pain or your pain gets worse.Do not begin these exercises until told by your health care provider. Range of Motion Exercises        These exercises warm up your muscles and joints and improve the movement and flexibility of your shoulder. These exercises also help to relieve pain, numbness, and tingling. These exercises involve stretching your injured shoulder directly. Exercise A: Pendulum 1. Stand near a wall or a surface that you can hold onto for balance. 2. Bend at the waist and let your left / right arm hang straight down. Use your other arm to support you. Keep your back straight and do not lock your knees. 3. Relax your left / right arm and shoulder muscles, and move your hips and your trunk so your left / right arm swings freely. Your arm should swing because of the motion of your body, not because you are using your arm or shoulder muscles. 4. Keep moving your body so your arm swings in the following directions, as told by your health care provider: ? Side to side. ? Forward and backward. ? In clockwise and counterclockwise circles. 5. Continue each motion for __________ seconds, or for as long as told by your health care provider. 6. Slowly return to the starting position. Repeat __________ times. Complete this exercise __________ times a day. Exercise B:Flexion, Standing 1. Stand and hold a broomstick, a cane, or a similar object. Place your hands a little more than shoulder-width apart on the object. Your left / right hand should be palm-up, and your other hand should be palm-down. 2. Keep your elbow straight and keep your shoulder muscles relaxed. Push the stick  down with your healthy arm to raise your left / right arm in front of your body, and then over your head until you feel a stretch in your shoulder. ? Avoid shrugging your shoulder while you raise your arm. Keep your shoulder blade tucked down toward the middle of your back. 3. Hold for __________ seconds. 4. Slowly return to the starting position. Repeat __________ times. Complete this exercise __________ times a day. Exercise C: Abduction, Standing 1. Stand and hold a broomstick, a cane, or a similar object. Place your hands a little more than shoulder-width apart on the object. Your left / right hand should be palm-up, and your other hand should be palm-down. 2. While keeping your elbow straight and your shoulder muscles relaxed, push the stick across your body toward your left / right side. Raise your left / right arm to the side of your body and then over your head until you feel a stretch in your shoulder. ? Do not raise your arm above shoulder height, unless your health care provider tells you to do that. ? Avoid shrugging your shoulder while you raise your arm. Keep your shoulder blade tucked down toward the middle of your back. 3. Hold for __________ seconds. 4. Slowly return to the starting position. Repeat __________ times. Complete this exercise __________ times a day. Exercise D:Internal Rotation 1. Place your left / right hand behind your back, palm-up. 2. Use your other hand to dangle an exercise band, a towel, or a similar object over your shoulder.   Grasp the band with your left / right hand so you are holding onto both ends. 3. Gently pull up on the band until you feel a stretch in the front of your left / right shoulder. ? Avoid shrugging your shoulder while you raise your arm. Keep your shoulder blade tucked down toward the middle of your back. 4. Hold for __________ seconds. 5. Release the stretch by letting go of the band and lowering your hands. Repeat __________ times.  Complete this exercise __________ times a day. Stretching Exercises  These exercises warm up your muscles and joints and improve the movement and flexibility of your shoulder. These exercises also help to relieve pain, numbness, and tingling. These exercises are done using your healthy shoulder to help stretch the muscles of your injured shoulder. Exercise E: Corner Stretch (External Rotation and Abduction) 1. Stand in a doorway with one of your feet slightly in front of the other. This is called a staggered stance. If you cannot reach your forearms to the door frame, stand facing a corner of a room. 2. Choose one of the following positions as told by your health care provider: ? Place your hands and forearms on the door frame above your head. ? Place your hands and forearms on the door frame at the height of your head. ? Place your hands on the door frame at the height of your elbows. 3. Slowly move your weight onto your front foot until you feel a stretch across your chest and in the front of your shoulders. Keep your head and chest upright and keep your abdominal muscles tight. 4. Hold for __________ seconds. 5. To release the stretch, shift your weight to your back foot. Repeat __________ times. Complete this stretch __________ times a day. Exercise F:Extension, Standing 1. Stand and hold a broomstick, a cane, or a similar object behind your back. ? Your hands should be a little wider than shoulder-width apart. ? Your palms should face away from your back. 2. Keeping your elbows straight and keeping your shoulder muscles relaxed, move the stick away from your body until you feel a stretch in your shoulder. ? Avoid shrugging your shoulders while you move the stick. Keep your shoulder blade tucked down toward the middle of your back. 3. Hold for __________ seconds. 4. Slowly return to the starting position. Repeat __________ times. Complete this exercise __________ times a  day. Strengthening Exercises           These exercises build strength and endurance in your shoulder. Endurance is the ability to use your muscles for a long time, even after they get tired. Exercise G:External Rotation 1. Sit in a stable chair without armrests. 2. Secure an exercise band at elbow height on your left / right side. 3. Place a soft object, such as a folded towel or a small pillow, between your left / right upper arm and your body to move your elbow a few inches away (about 10 cm) from your side. 4. Hold the end of the band so it is tight and there is no slack. 5. Keeping your elbow pressed against the soft object, move your left / right forearm out, away from your abdomen. Keep your body steady so only your forearm moves. 6. Hold for __________ seconds. 7. Slowly return to the starting position. Repeat __________ times. Complete this exercise __________ times a day. Exercise H:Shoulder Abduction 1. Sit in a stable chair without armrests, or stand. 2. Hold a __________ weight in your   left / right hand, or hold an exercise band with both hands. 3. Start with your arms straight down and your left / right palm facing in, toward your body. 4. Slowly lift your left / right hand out to your side. Do not lift your hand above shoulder height unless your health care provider tells you that this is safe. ? Keep your arms straight. ? Avoid shrugging your shoulder while you do this movement. Keep your shoulder blade tucked down toward the middle of your back. 5. Hold for __________ seconds. 6. Slowly lower your arm, and return to the starting position. Repeat __________ times. Complete this exercise __________ times a day. Exercise I:Shoulder Extension 1. Sit in a stable chair without armrests, or stand. 2. Secure an exercise band to a stable object in front of you where it is at shoulder height. 3. Hold one end of the exercise band in each hand. Your palms should face each  other. 4. Straighten your elbows and lift your hands up to shoulder height. 5. Step back, away from the secured end of the exercise band, until the band is tight and there is no slack. 6. Squeeze your shoulder blades together as you pull your hands down to the sides of your thighs. Stop when your hands are straight down by your sides. Do not let your hands go behind your body. 7. Hold for __________ seconds. 8. Slowly return to the starting position. Repeat __________ times. Complete this exercise __________ times a day. Exercise J:Standing Shoulder Row 1. Sit in a stable chair without armrests, or stand. 2. Secure an exercise band to a stable object in front of you so it is at waist height. 3. Hold one end of the exercise band in each hand. Your palms should be in a thumbs-up position. 4. Bend each of your elbows to an "L" shape (about 90 degrees) and keep your upper arms at your sides. 5. Step back until the band is tight and there is no slack. 6. Slowly pull your elbows back behind you. 7. Hold for __________ seconds. 8. Slowly return to the starting position. Repeat __________ times. Complete this exercise __________ times a day. Exercise K:Shoulder Press-Ups 1. Sit in a stable chair that has armrests. Sit upright, with your feet flat on the floor. 2. Put your hands on the armrests so your elbows are bent and your fingers are pointing forward. Your hands should be about even with the sides of your body. 3. Push down on the armrests and use your arms to lift yourself off of the chair. Straighten your elbows and lift yourself up as much as you comfortably can. ? Move your shoulder blades down, and avoid letting your shoulders move up toward your ears. ? Keep your feet on the ground. As you get stronger, your feet should support less of your body weight as you lift yourself up. 4. Hold for __________ seconds. 5. Slowly lower yourself back into the chair. Repeat __________ times. Complete  this exercise __________ times a day. Exercise L: Wall Push-Ups 1. Stand so you are facing a stable wall. Your feet should be about one arm-length away from the wall. 2. Lean forward and place your palms on the wall at shoulder height. 3. Keep your feet flat on the floor as you bend your elbows and lean forward toward the wall. 4. Hold for __________ seconds. 5. Straighten your elbows to push yourself back to the starting position. Repeat __________ times. Complete this exercise __________ times   a day. This information is not intended to replace advice given to you by your health care provider. Make sure you discuss any questions you have with your health care provider. Document Released: 11/24/2004 Document Revised: 05/16/2017 Document Reviewed: 09/21/2014 Elsevier Interactive Patient Education  2019 [employer]  

## 2018-04-04 ENCOUNTER — Telehealth: Payer: Self-pay | Admitting: Family Medicine

## 2018-04-04 NOTE — Telephone Encounter (Signed)
Patient states that she was concerned bout her ears. She states that the right ear is making a popping noise and has a sharp pain. She states that the left ear only has the popping noise when she blows her nose.

## 2018-04-04 NOTE — Telephone Encounter (Signed)
Patient advised. She is scheduled for 04/05/2018.

## 2018-04-04 NOTE — Telephone Encounter (Signed)
Pt called saying she was in on March 5 and seen Dr, B for sinus and ear congestion.  She has one day of antibiotics left but she is still having a lot of congestion and her ears are stopped up.    Please advise [phone]  Thanks teri

## 2018-04-04 NOTE — Telephone Encounter (Signed)
The antibiotic treats the infection, not congestion.  Use flonase and OTC antihistamine daily and try nasal saline rinses to relieve congestion

## 2018-04-04 NOTE — Telephone Encounter (Signed)
She can come in for [place] to look at them.  The flonase can help with eustachian tube dysfunction that may be causign it, but I can't say what is causing it without looking at her ears

## 2018-04-05 ENCOUNTER — Other Ambulatory Visit: Payer: Self-pay

## 2018-04-05 ENCOUNTER — Encounter: Payer: Self-pay | Admitting: Family Medicine

## 2018-04-05 ENCOUNTER — Ambulatory Visit (INDEPENDENT_AMBULATORY_CARE_PROVIDER_SITE_OTHER): Payer: Managed Care, Other (non HMO) | Admitting: Family Medicine

## 2018-04-05 VITALS — BP 115/79 | HR 90 | Temp 97.9°F | Wt 197.2 lb

## 2018-04-05 DIAGNOSIS — H6981 Other specified disorders of Eustachian tube, right ear: Secondary | ICD-10-CM | POA: Diagnosis not present

## 2018-04-05 MED ORDER — FLUTICASONE PROPIONATE 50 MCG/ACT NA SUSP: 2.0000 | Freq: Every day | NASAL | 6 refills | Status: DC

## 2018-04-05 NOTE — Patient Instructions (Signed)

## 2018-04-05 NOTE — Progress Notes (Signed)
Patient: Ann [name] Female    DOB: September 13, 1985   33 y.o.   MRN: [id number] Visit Date: 04/05/2018  Today's Provider: Shirlee Latch, MD   Chief Complaint  Patient presents with  . Ear Pain   Subjective:    I, [name], CMA, am acting as a scribe for Shirlee Latch, MD.    HPI Ear Pain:  Patient presents for a 1 week follow up. Last OV was on 03/29/2018. Patient was diagnosed with acute otitis media of right ear. She was treated with Augmentin. She reports good compliance with treatment plan. She states symptoms are slightly improved, but when blowing her nose her ears are "popping"    Allergies  Allergen Reactions  . Darvocet [Propoxyphene N-Acetaminophen] Nausea And Vomiting  . Latex   . Meperidine Nausea And Vomiting    Syncope     Current Outpatient Medications:  .  B COMPLEX VITAMINS PO, Take 1 tablet by mouth daily. Reported on 03/09/2015, Disp: , Rfl:  .  cyclobenzaprine (FLEXERIL) 5 MG tablet, Take 1 tablet (5 mg total) by mouth 3 (three) times daily as needed for muscle spasms., Disp: 30 tablet, Rfl: 1 .  levonorgestrel (MIRENA, 52 MG,) 20 MCG/24HR IUD, Mirena 20 mcg/24 hours (5 yrs) 52 mg intrauterine device  Take 1 device by intrauterine route., Disp: , Rfl:  .  loratadine (CLARITIN) 10 MG tablet, Take 10 mg by mouth daily., Disp: , Rfl:  .  MULTIPLE VITAMIN PO, Take 1 tablet by mouth daily. , Disp: , Rfl:  .  Probiotic Product (PROBIOTIC DAILY PO), Take by mouth., Disp: , Rfl:   Review of Systems  Constitutional: Negative.   HENT: Positive for ear pain (ear fullness).   Respiratory: Positive for cough.   Cardiovascular: Negative.   Musculoskeletal: Negative.     Social History   Tobacco Use  . Smoking status: Former Smoker    Last attempt to quit: 08/28/2009    Years since quitting: 8.6  . Smokeless tobacco: Never Used  Substance Use Topics  . Alcohol use: Yes      Objective:   BP 115/79 (BP Location: Right Arm, Patient Position:  Sitting, Cuff Size: Normal)   Pulse 90   Temp 97.9 F (36.6 C) (Oral)   Wt 197 lb 3.2 oz (89.4 kg)   SpO2 99%   BMI 33.85 kg/m  Vitals:   04/05/18 1334  BP: 115/79  Pulse: 90  Temp: 97.9 F (36.6 C)  TempSrc: Oral  SpO2: 99%  Weight: 197 lb 3.2 oz (89.4 kg)     Physical Exam Vitals signs reviewed.  Constitutional:      General: She is not in acute distress.    Appearance: Normal appearance. She is not diaphoretic.  HENT:     Head: Normocephalic and atraumatic.     Right Ear: Tympanic membrane, ear canal and external ear normal.     Left Ear: Tympanic membrane, ear canal and external ear normal.     Nose: Congestion present. No rhinorrhea.     Mouth/Throat:     Pharynx: Oropharynx is clear. No oropharyngeal exudate.  Eyes:     General: No scleral icterus.    Conjunctiva/sclera: Conjunctivae normal.     Pupils: Pupils are equal, round, and reactive to light.  Neck:     Musculoskeletal: Neck supple.  Cardiovascular:     Rate and Rhythm: Normal rate and regular rhythm.     Pulses: Normal pulses.  Heart sounds: Normal heart sounds. No murmur.  Pulmonary:     Effort: Pulmonary effort is normal. No respiratory distress.     Breath sounds: Normal breath sounds. No wheezing or rhonchi.  Musculoskeletal:     Right lower leg: No edema.     Left lower leg: No edema.  Lymphadenopathy:     Cervical: No cervical adenopathy.  Skin:    General: Skin is warm and dry.     Capillary Refill: Capillary refill takes less than 2 seconds.     Findings: No rash.  Neurological:     Mental Status: She is alert and oriented to person, place, and time. Mental status is at baseline.  Psychiatric:        Mood and Affect: Mood normal.        Behavior: Behavior normal.         Assessment & Plan   1. Dysfunction of right eustachian tube - AOM resolved - now with symptoms c/w ETD - discussed decongestants and flonase use - discussed symptomatic management, natural course, and  return precautions     Meds ordered this encounter  Medications  . fluticasone (FLONASE) 50 MCG/ACT nasal spray    Sig: Place 2 sprays into both nostrils daily.    Dispense:  16 g    Refill:  6     Return if symptoms worsen or fail to improve.   The entirety of the information documented in the History of Present Illness, Review of Systems and Physical Exam were personally obtained by me. Portions of this information were initially documented by [name], CMA and reviewed by me for thoroughness and accuracy.    Erasmo Downer, MD, MPH [facility] 04/05/2018 3:26 PM

## 2018-06-08 ENCOUNTER — Other Ambulatory Visit: Payer: Self-pay

## 2018-06-08 ENCOUNTER — Ambulatory Visit
Admission: RE | Admit: 2018-06-08 | Discharge: 2018-06-08 | Disposition: A | Discharge status: Home / Self Care | Payer: Managed Care, Other (non HMO) | Attending: Physician Assistant | Admitting: Physician Assistant

## 2018-06-08 ENCOUNTER — Ambulatory Visit (INDEPENDENT_AMBULATORY_CARE_PROVIDER_SITE_OTHER): Payer: Managed Care, Other (non HMO) | Admitting: Physician Assistant

## 2018-06-08 ENCOUNTER — Ambulatory Visit
Admission: RE | Admit: 2018-06-08 | Discharge: 2018-06-08 | Disposition: A | Discharge status: Home / Self Care | Payer: Managed Care, Other (non HMO) | Source: Ambulatory Visit | Attending: Physician Assistant | Admitting: Physician Assistant

## 2018-06-08 ENCOUNTER — Encounter: Payer: Self-pay | Admitting: Physician Assistant

## 2018-06-08 VITALS — BP 111/76 | HR 97 | Temp 98.7°F | Resp 16 | Wt 195.8 lb

## 2018-06-08 DIAGNOSIS — M7541 Impingement syndrome of right shoulder: Secondary | ICD-10-CM | POA: Insufficient documentation

## 2018-06-08 MED ORDER — METHYLPREDNISOLONE ACETATE 40 MG/ML IJ SUSP
80.0000 mg | Freq: Once | INTRAMUSCULAR | Status: AC
  Administered 2018-06-08: 80 mg via INTRA_ARTICULAR

## 2018-06-08 NOTE — Progress Notes (Signed)
Patient: Ann [name] Female    DOB: 1985-05-30   33 y.o.   MRN: [id number] Visit Date: 06/08/2018  Today's Provider: Margaretann LovelessJennifer M Willi Borowiak, PA-C   Chief Complaint  Patient presents with  . Shoulder Pain   Subjective:     Shoulder Pain   The pain is present in the right shoulder. This is a chronic problem. Episode onset: Flare up this week. The quality of the pain is described as aching and burning. The pain is at a severity of 6/10. The pain is moderate. Associated symptoms include a limited range of motion and tingling (in her fingers from the pain radiating down her arm). Pertinent negatives include no joint locking, joint swelling, numbness or stiffness. The symptoms are aggravated by activity. She has tried acetaminophen and NSAIDS (was doing the flexeril) for the symptoms. The treatment provided no relief.      Allergies  Allergen Reactions  . Darvocet [Propoxyphene N-Acetaminophen] Nausea And Vomiting  . Latex   . Meperidine Nausea And Vomiting    Syncope     Current Outpatient Medications:  .  B COMPLEX VITAMINS PO, Take 1 tablet by mouth daily. Reported on 03/09/2015, Disp: , Rfl:  .  levonorgestrel (MIRENA, 52 MG,) 20 MCG/24HR IUD, Mirena 20 mcg/24 hours (5 yrs) 52 mg intrauterine device  Take 1 device by intrauterine route., Disp: , Rfl:  .  loratadine (CLARITIN) 10 MG tablet, Take 10 mg by mouth daily., Disp: , Rfl:  .  MULTIPLE VITAMIN PO, Take 1 tablet by mouth daily. , Disp: , Rfl:  .  cyclobenzaprine (FLEXERIL) 5 MG tablet, Take 1 tablet (5 mg total) by mouth 3 (three) times daily as needed for muscle spasms. (Patient not taking: Reported on 06/08/2018), Disp: 30 tablet, Rfl: 1 .  fluticasone (FLONASE) 50 MCG/ACT nasal spray, Place 2 sprays into both nostrils daily. (Patient not taking: Reported on 06/08/2018), Disp: 16 g, Rfl: 6 .  Probiotic Product (PROBIOTIC DAILY PO), Take by mouth., Disp: , Rfl:   Review of Systems  Constitutional: Negative.    Respiratory: Negative.   Cardiovascular: Negative.   Gastrointestinal: Negative.   Musculoskeletal: Positive for arthralgias and myalgias. Negative for neck pain, neck stiffness and stiffness.  Neurological: Positive for tingling (in her fingers from the pain radiating down her arm). Negative for dizziness, numbness and headaches.    Social History   Tobacco Use  . Smoking status: Former Smoker    Last attempt to quit: 08/28/2009    Years since quitting: 8.7  . Smokeless tobacco: Never Used  Substance Use Topics  . Alcohol use: Yes      Objective:   BP 111/76 (BP Location: Left Arm, Patient Position: Sitting, Cuff Size: Large)   Pulse 97   Temp 98.7 F (37.1 C) (Oral)   Resp 16   Wt 195 lb 12.8 oz (88.8 kg)   BMI 33.61 kg/m  Vitals:   06/08/18 1326  BP: 111/76  Pulse: 97  Resp: 16  Temp: 98.7 F (37.1 C)  TempSrc: Oral  Weight: 195 lb 12.8 oz (88.8 kg)     Physical Exam Vitals signs reviewed.  Constitutional:      General: She is not in acute distress.    Appearance: Normal appearance. She is well-developed and normal weight. She is not ill-appearing or diaphoretic.  Neck:     Musculoskeletal: Normal range of motion and neck supple. Normal range of motion. No spinous process tenderness or muscular tenderness.  Cardiovascular:     Rate and Rhythm: Normal rate and regular rhythm.     Heart sounds: Normal heart sounds. No murmur. No friction rub. No gallop.   Pulmonary:     Effort: Pulmonary effort is normal. No respiratory distress.     Breath sounds: Normal breath sounds. No wheezing or rales.  Musculoskeletal:     Right shoulder: She exhibits decreased range of motion and tenderness. She exhibits no bony tenderness, no swelling, no effusion, no crepitus, no deformity, no laceration, no pain, no spasm, normal pulse and normal strength.     Comments: Positive Hawkins Impingement Negative Empty can test  Neurological:     Mental Status: She is alert.          Assessment & Plan    1. Impingement syndrome of right shoulder Steroid injection given today in the office without issue, see procedure note below. Will get imaging to r/o bony abnormalities. Call if worsening as she may require ortho referral.  - DG Shoulder Right; Future - methylPREDNISolone acetate (DEPO-MEDROL) injection 80 mg  Procedure Note: Benefits, risks (including infection, tattooing, adipose dimpling, and tendon rupture) and alternatives were explained to the patient. All questions were sought and answered.  Patient agreed to continue and verbal consent was obtained.   A steroid injection was performed on right shoulder using 5cc of 1% plain Xyloocaine and 80 mg of depo-medrol. This was well tolerated.     Margaretann Loveless, PA-C  [facility]

## 2018-06-08 NOTE — Patient Instructions (Addendum)
Shoulder Injection, Care After Refer to this sheet in the next few weeks. These instructions provide you with information about caring for yourself after your procedure. Your health care provider may also give you more specific instructions. Your treatment has been planned according to current medical practices, but problems sometimes occur. Call your health care provider if you have any problems or questions after your procedure. WHAT TO EXPECT AFTER THE PROCEDURE After your procedure, it is common to have:  Soreness.  Warmth.  Swelling. You may have more pain, swelling, and warmth than you did before the injection. This reaction may last for about one day.  HOME CARE INSTRUCTIONS Bathing  If you were given a bandage (dressing), keep it dry until your health care provider says it can be removed. Ask your health care provider when you can start showering or taking a bath. Managing Pain, Stiffness, and Swelling  If directed, apply ice to the injection area:  Put ice in a plastic bag.  Place a towel between your skin and the bag.  Leave the ice on for 20 minutes, 2-3 times per day.  Do not apply heat to your shoulder.  Raise the injection area above the level of your heart while you are sitting or lying down. Activity  Avoid strenuous activities for as long as directed by your health care provider. Ask your health care provider when you can return to your normal activities. General Instructions  Take medicines only as directed by your health care provider.  Do not take aspirin or other over-the-counter medicines unless your health care provider says you can.  Check your injection site every day for signs of infection. Watch for:  Redness, swelling, or pain.  Fluid, blood, or pus.  Follow your health care provider's instructions about dressing changes and removal. SEEK MEDICAL CARE IF:  You have symptoms at your injection site that last longer than two days after your  procedure.  You have redness, swelling, or pain in your injection area.  You have fluid, blood, or pus coming from your injection site.  You have warmth in your injection area.  You have a fever.  Your pain is not controlled with medicine. SEEK IMMEDIATE MEDICAL CARE IF:  Your shoulder turns very red.  Your shoulder becomes very swollen.  Your shoulder pain is severe.   This information is not intended to replace advice given to you by your health care provider. Make sure you discuss any questions you have with your health care provider.   Document Released: 01/31/2014 Document Reviewed: 01/31/2014 Elsevier Interactive Patient Education 2016 Elsevier Inc.   Shoulder Impingement Syndrome  Shoulder impingement syndrome is a condition that causes pain when connective tissues (tendons) surrounding the shoulder joint become pinched. These tendons are part of the group of muscles and tissues that help to stabilize the shoulder (rotator cuff). Beneath the rotator cuff is a fluid-filled sac (bursa) that allows the muscles and tendons to glide smoothly. The bursa may become swollen or irritated (bursitis). Bursitis, swelling in the rotator cuff tendons, or both conditions can decrease how much space is under a bone in the shoulder joint (acromion), resulting in impingement. What are the causes? Shoulder impingement syndrome may be caused by bursitis or swelling of the rotator cuff tendons, which may result from:  Repetitive overhead arm movements.  Falling onto the shoulder.  Weakness in the shoulder muscles. What increases the risk? You may be more likely to develop this condition if you:  Play sports that  involve throwing, such as baseball.  Participate in sports such as tennis, volleyball, and swimming.  Work as a Education administratorpainter, Music therapistcarpenter, or Pharmacologistfruit picker. Some people are also more likely to develop impingement syndrome because of the shape of their acromion bone. What are the  signs or symptoms? The main symptom of this condition is pain on the front or side of the shoulder. The pain may:  Get worse when lifting or raising the arm.  Get worse at night.  Wake you up from sleeping.  Feel sharp when the shoulder is moved and then fade to an ache. Other symptoms may include:  Tenderness.  Stiffness.  Inability to raise the arm above shoulder level or behind the body.  Weakness. How is this diagnosed? This condition may be diagnosed based on:  Your symptoms and medical history.  A physical exam.  Imaging tests, such as: ? X-rays. ? MRI. ? Ultrasound. How is this treated? This condition may be treated by:  Resting your shoulder and avoiding all activities that cause pain or put stress on the shoulder.  Icing your shoulder.  NSAIDs to help reduce pain and swelling.  One or more injections of medicines to numb the area and reduce inflammation.  Physical therapy.  Surgery. This may be needed if nonsurgical treatments have not helped. Surgery may involve repairing the rotator cuff, reshaping the acromion, or removing the bursa. Follow these instructions at home: Managing pain, stiffness, and swelling   If directed, put ice on the injured area. ? Put ice in a plastic bag. ? Place a towel between your skin and the bag. ? Leave the ice on for 20 minutes, 2-3 times a day. Activity  Rest and return to your normal activities as told by your health care provider. Ask your health care provider what activities are safe for you.  Do exercises as told by your health care provider. General instructions  Do not use any products that contain nicotine or tobacco, such as cigarettes, e-cigarettes, and chewing tobacco. These can delay healing. If you need help quitting, ask your health care provider.  Ask your health care provider when it is safe for you to drive.  Take over-the-counter and prescription medicines only as told by your health care  provider.  Keep all follow-up visits as told by your health care provider. This is important. How is this prevented?  Give your body time to rest between periods of activity.  Be safe and responsible while being active. This will help you avoid falls.  Maintain physical fitness, including strength and flexibility. Contact a health care provider if:  Your symptoms have not improved after 1-2 months of treatment and rest.  You cannot lift your arm away from your body. Summary  Shoulder impingement syndrome is a condition that causes pain when connective tissues (tendons) surrounding the shoulder joint become pinched.  The main symptom of this condition is pain on the front or side of the shoulder.  This condition is usually treated with rest, ice, and pain medicines as needed. This information is not intended to replace advice given to you by your health care provider. Make sure you discuss any questions you have with your health care provider. Document Released: 01/10/2005 Document Revised: 07/05/2017 Document Reviewed: 07/05/2017 Elsevier Interactive Patient Education  [employer]2019 [employer] Inc[employer]

## 2018-06-12 ENCOUNTER — Telehealth: Payer: Self-pay

## 2018-06-12 NOTE — Telephone Encounter (Signed)
Patient was seen by Antony Contras on Friday (06/08/2018) and she has not heard from her xray results. Please review. Thanks!

## 2018-06-13 NOTE — Telephone Encounter (Signed)
Did personally review XRay and it is negative. I imagine the result went to Jenni's box.  Thank you for informing the patient.

## 2018-06-13 NOTE — Telephone Encounter (Signed)
Patient called back and she was told that you were not in the office, the x-ray had not been signed but I did tell her it was negative.  I am not sure why they sent this message to your box instead while out of the office

## 2018-12-17 ENCOUNTER — Encounter: Payer: Self-pay | Admitting: Adult Health

## 2018-12-17 ENCOUNTER — Telehealth: Payer: Self-pay

## 2018-12-17 ENCOUNTER — Ambulatory Visit (INDEPENDENT_AMBULATORY_CARE_PROVIDER_SITE_OTHER): Payer: Managed Care, Other (non HMO) | Admitting: Adult Health

## 2018-12-17 ENCOUNTER — Other Ambulatory Visit
Admission: RE | Admit: 2018-12-17 | Discharge: 2018-12-17 | Disposition: A | Discharge status: Home / Self Care | Payer: Managed Care, Other (non HMO) | Source: Ambulatory Visit | Attending: Adult Health | Admitting: Adult Health

## 2018-12-17 ENCOUNTER — Other Ambulatory Visit: Payer: Self-pay

## 2018-12-17 ENCOUNTER — Ambulatory Visit
Admission: RE | Admit: 2018-12-17 | Discharge: 2018-12-17 | Disposition: A | Discharge status: Home / Self Care | Payer: Managed Care, Other (non HMO) | Source: Ambulatory Visit | Attending: Adult Health | Admitting: Adult Health

## 2018-12-17 DIAGNOSIS — R05 Cough: Secondary | ICD-10-CM

## 2018-12-17 DIAGNOSIS — R059 Cough, unspecified: Secondary | ICD-10-CM

## 2018-12-17 DIAGNOSIS — H9201 Otalgia, right ear: Secondary | ICD-10-CM

## 2018-12-17 DIAGNOSIS — R0981 Nasal congestion: Secondary | ICD-10-CM | POA: Diagnosis not present

## 2018-12-17 LAB — COMPREHENSIVE METABOLIC PANEL
ALT: 18 U/L (ref 0–44)
AST: 18 U/L (ref 15–41)
Albumin: 4.4 g/dL (ref 3.5–5.0)
Alkaline Phosphatase: 59 U/L (ref 38–126)
Anion gap: 9 (ref 5–15)
BUN: 21 mg/dL — ABNORMAL HIGH (ref 6–20)
CO2: 24 mmol/L (ref 22–32)
Calcium: 9 mg/dL (ref 8.9–10.3)
Chloride: 105 mmol/L (ref 98–111)
Creatinine, Ser: 0.96 mg/dL (ref 0.44–1.00)
GFR calc Af Amer: >60 mL/min (ref >60)
GFR calc non Af Amer: >60 mL/min (ref >60)
Glucose, Bld: 111 mg/dL — ABNORMAL HIGH (ref 70–99)
Potassium: 3.9 mmol/L (ref 3.5–5.1)
Sodium: 138 mmol/L (ref 135–145)
Total Bilirubin: 0.3 mg/dL (ref 0.3–1.2)
Total Protein: 7.2 g/dL (ref 6.5–8.1)

## 2018-12-17 LAB — CBC WITH DIFFERENTIAL/PLATELET
Abs Immature Granulocytes: 0.03 10*3/uL (ref 0.00–0.07)
Basophils Absolute: 0.1 10*3/uL (ref 0.0–0.1)
Basophils Relative: 1 %
Eosinophils Absolute: 0.3 10*3/uL (ref 0.0–0.5)
Eosinophils Relative: 3 %
HCT: 37.6 % (ref 36.0–46.0)
Hemoglobin: 12.7 g/dL (ref 12.0–15.0)
Immature Granulocytes: 0 %
Lymphocytes Relative: 29 %
Lymphs Abs: 2.7 10*3/uL (ref 0.7–4.0)
MCH: 28.1 pg (ref 26.0–34.0)
MCHC: 33.8 g/dL (ref 30.0–36.0)
MCV: 83.2 fL (ref 80.0–100.0)
Monocytes Absolute: 0.6 10*3/uL (ref 0.1–1.0)
Monocytes Relative: 6 %
Neutro Abs: 5.7 10*3/uL (ref 1.7–7.7)
Neutrophils Relative %: 61 %
Platelets: 331 10*3/uL (ref 150–400)
RBC: 4.52 MIL/uL (ref 3.87–5.11)
RDW: 12.1 % (ref 11.5–15.5)
WBC: 9.4 10*3/uL (ref 4.0–10.5)
nRBC: 0 % (ref 0.0–0.2)

## 2018-12-17 MED ORDER — BENZONATATE 200 MG PO CAPS: 200.0000 mg | ORAL_CAPSULE | Freq: Every evening | ORAL | 0 refills | Status: DC | PRN

## 2018-12-17 MED ORDER — AMOXICILLIN-POT CLAVULANATE 875-125 MG PO TABS: 1.0000 | ORAL_TABLET | Freq: Two times a day (BID) | ORAL | 0 refills | Status: DC

## 2018-12-17 MED ORDER — PREDNISONE 10 MG (21) PO TBPK: ORAL_TABLET | ORAL | 0 refills | Status: DC

## 2018-12-17 MED ORDER — FLUTICASONE PROPIONATE 50 MCG/ACT NA SUSP: 2.0000 | Freq: Every day | NASAL | 6 refills | Status: DC

## 2018-12-17 NOTE — Patient Instructions (Signed)
Cough, Adult A cough helps to clear your throat and lungs. A cough may be a sign of an illness or another medical condition. An acute cough may only last 2-3 weeks, while a chronic cough may last 8 or more weeks. Many things can cause a cough. They include:  Germs (viruses or bacteria) that attack the airway.  Breathing in things that bother (irritate) your lungs.  Allergies.  Asthma.  Mucus that runs down the back of your throat (postnasal drip).  Smoking.  Acid backing up from the stomach into the tube that moves food from the mouth to the stomach (gastroesophageal reflux).  Some medicines.  Lung problems.  Other medical conditions, such as heart failure or a blood clot in the lung (pulmonary embolism). Follow these instructions at home: Medicines  Take over-the-counter and prescription medicines only as told by your doctor.  Talk with your doctor before you take medicines that stop a cough (coughsuppressants). Lifestyle   Do not smoke, and try not to be around smoke. Do not use any products that contain nicotine or tobacco, such as cigarettes, e-cigarettes, and chewing tobacco. If you need help quitting, ask your doctor.  Drink enough fluid to keep your pee (urine) pale yellow.  Avoid caffeine.  Do not drink alcohol if your doctor tells you not to drink. General instructions   Watch for any changes in your cough. Tell your doctor about them.  Always cover your mouth when you cough.  Stay away from things that make you cough, such as perfume, candles, campfire smoke, or cleaning products.  If the air is dry, use a cool mist vaporizer or humidifier in your home.  If your cough is worse at night, try using extra pillows to raise your head up higher while you sleep.  Rest as needed.  Keep all follow-up visits as told by your doctor. This is important. Contact a doctor if:  You have new symptoms.  You cough up pus.  Your cough does not get better after 2-3  weeks, or your cough gets worse.  Cough medicine does not help your cough and you are not sleeping well.  You have pain that gets worse or pain that is not helped with medicine.  You have a fever.  You are losing weight and you do not know why.  You have night sweats. Get help right away if:  You cough up blood.  You have trouble breathing.  Your heartbeat is very fast. These symptoms may be an emergency. Do not wait to see if the symptoms will go away. Get medical help right away. Call your local emergency services (911 in the [place] Do not drive yourself to the hospital. Summary  A cough helps to clear your throat and lungs. Many things can cause a cough.  Take over-the-counter and prescription medicines only as told by your doctor.  Always cover your mouth when you cough.  Contact a doctor if you have new symptoms or you have a cough that does not get better or gets worse. This information is not intended to replace advice given to you by your health care provider. Make sure you discuss any questions you have with your health care provider. Document Released: 09/23/2010 Document Revised: 01/29/2018 Document Reviewed: 01/29/2018 Elsevier Patient Education  2020 [employer] Prednisolone tablets What is this medicine? PREDNISOLONE (pred NISS oh lone) is a corticosteroid. It is commonly used to treat inflammation of the skin, joints, lungs, and other organs. Common conditions treated include asthma,  allergies, and arthritis. It is also used for other conditions, such as blood disorders and diseases of the adrenal glands. This medicine may be used for other purposes; ask your health care provider or pharmacist if you have questions. COMMON BRAND NAME(S): Millipred, Millipred DP, Millipred DP 12-Day, Millipred DP 6 Day, Prednoral What should I tell my health care provider before I take this medicine? They need to know if you have any of these conditions:  Cushing's  syndrome  diabetes  glaucoma  heart problems or disease  high blood pressure  infection such as herpes, measles, tuberculosis, or chickenpox  kidney disease  liver disease  mental problems  myasthenia gravis  osteoporosis  seizures  stomach ulcer or intestine disease including colitis and diverticulitis  thyroid problem  an unusual or allergic reaction to lactose, prednisolone, other medicines, foods, dyes, or preservatives  pregnant or trying to get pregnant  breast-feeding How should I use this medicine? Take this medicine by mouth with a glass of water. Follow the directions on the prescription label. Take it with food or milk to avoid stomach upset. If you are taking this medicine once a day, take it in the morning. Do not take more medicine than you are told to take. Do not suddenly stop taking your medicine because you may develop a severe reaction. Your doctor will tell you how much medicine to take. If your doctor wants you to stop the medicine, the dose may be slowly lowered over time to avoid any side effects. Talk to your pediatrician regarding the use of this medicine in children. Special care may be needed. Overdosage: If you think you have taken too much of this medicine contact a poison control center or emergency room at once. NOTE: This medicine is only for you. Do not share this medicine with others. What if I miss a dose? If you miss a dose, take it as soon as you can. If it is almost time for your next dose, take only that dose. Do not take double or extra doses. What may interact with this medicine? Do not take this medicine with any of the following medications:  metyrapone  mifepristone This medicine may also interact with the following medications:  aminoglutethimide  amphotericin B  aspirin and aspirin-like medicines  barbiturates  certain medicines for diabetes, like glipizide or glyburide  cholestyramine  cholinesterase  inhibitors  cyclosporine  digoxin  diuretics  ephedrine  female hormones, like estrogens and birth control pills  isoniazid  ketoconazole  NSAIDS, medicines for pain and inflammation, like ibuprofen or naproxen  phenytoin  rifampin  toxoids  vaccines  warfarin This list may not describe all possible interactions. Give your health care provider a list of all the medicines, herbs, non-prescription drugs, or dietary supplements you use. Also tell them if you smoke, drink alcohol, or use illegal drugs. Some items may interact with your medicine. What should I watch for while using this medicine? Visit your doctor or health care professional for regular checks on your progress. If you are taking this medicine over a prolonged period, carry an identification card with your name and address, the type and dose of your medicine, and your doctor's name and address. This medicine may increase your risk of getting an infection. Tell your doctor or health care professional if you are around anyone with measles or chickenpox, or if you develop sores or blisters that do not heal properly. If you are going to have surgery, tell your doctor or health  care professional that you have taken this medicine within the last twelve months. Ask your doctor or health care professional about your diet. You may need to lower the amount of salt you eat. This medicine may increase blood sugar. Ask your healthcare provider if changes in diet or medicines are needed if you have diabetes. What side effects may I notice from receiving this medicine? Side effects that you should report to your doctor or health care professional as soon as possible:  allergic reactions like skin rash, itching or hives, swelling of the face, lips, or tongue  changes in emotions or moods  eye pain   signs and symptoms of high blood sugar such as being more thirsty or hungry or having to urinate more than normal. You may also  feel very tired or have blurry vision.  signs and symptoms of infection like fever or chills; cough; sore throat; pain or trouble passing urine  slow growth in children (if used for longer periods of time)  swelling of ankles, feet  trouble sleeping  weak bones (if used for longer periods of time) Side effects that usually do not require medical attention (report to your doctor or health care professional if they continue or are bothersome):  nausea  skin problems, acne, thin and shiny skin  upset stomach  weight gain This list may not describe all possible side effects. Call your doctor for medical advice about side effects. You may report side effects to FDA at 1-800-FDA-1088. Where should I keep my medicine? Keep out of the reach of children. Store at room temperature between 15 and 30 degrees C (59 and 86 degrees F). Keep container tightly closed. Throw away any unused medicine after the expiration date. NOTE: This sheet is a summary. It may not cover all possible information. If you have questions about this medicine, talk to your doctor, pharmacist, or health care provider.  2020 Elsevier/Gold Standard (2017-10-12 10:30:56) Benzonatate capsules What is this medicine? BENZONATATE (ben ZOE na tate) is used to treat cough. This medicine may be used for other purposes; ask your health care provider or pharmacist if you have questions. COMMON BRAND NAME(S): Tessalon Perles, Zonatuss What should I tell my health care provider before I take this medicine? They need to know if you have any of these conditions:  kidney or liver disease  an unusual or allergic reaction to benzonatate, anesthetics, other medicines, foods, dyes, or preservatives  pregnant or trying to get pregnant  breast-feeding How should I use this medicine? Take this medicine by mouth with a glass of water. Follow the directions on the prescription label. Avoid breaking, chewing, or sucking the capsule, as  this can cause serious side effects. Take your medicine at regular intervals. Do not take your medicine more often than directed. Talk to your pediatrician regarding the use of this medicine in children. While this drug may be prescribed for children as young as 33 years old for selected conditions, precautions do apply. Overdosage: If you think you have taken too much of this medicine contact a poison control center or emergency room at once. NOTE: This medicine is only for you. Do not share this medicine with others. What if I miss a dose? If you miss a dose, take it as soon as you can. If it is almost time for your next dose, take only that dose. Do not take double or extra doses. What may interact with this medicine? Do not take this medicine with any of the  following medications:  MAOIs like Carbex, Eldepryl, Marplan, Nardil, and Parnate This list may not describe all possible interactions. Give your health care provider a list of all the medicines, herbs, non-prescription drugs, or dietary supplements you use. Also tell them if you smoke, drink alcohol, or use illegal drugs. Some items may interact with your medicine. What should I watch for while using this medicine? Tell your doctor if your symptoms do not improve or if they get worse. If you have a high fever, skin rash, or headache, see your health care professional. You may get drowsy or dizzy. Do not drive, use machinery, or do anything that needs mental alertness until you know how this medicine affects you. Do not sit or stand up quickly, especially if you are an older patient. This reduces the risk of dizzy or fainting spells. What side effects may I notice from receiving this medicine? Side effects that you should report to your doctor or health care professional as soon as possible:  allergic reactions like skin rash, itching or hives, swelling of the face, lips, or tongue  breathing problems  chest pain  confusion or  hallucinations  irregular heartbeat  numbness of mouth or throat  seizures Side effects that usually do not require medical attention (report to your doctor or health care professional if they continue or are bothersome):  burning feeling in the eyes  constipation  headache  nasal congestion  stomach upset This list may not describe all possible side effects. Call your doctor for medical advice about side effects. You may report side effects to FDA at 1-800-FDA-1088. Where should I keep my medicine? Keep out of the reach of children. Store at room temperature between 15 and 30 degrees C (59 and 86 degrees F). Keep tightly closed. Protect from light and moisture. Throw away any unused medicine after the expiration date. NOTE: This sheet is a summary. It may not cover all possible information. If you have questions about this medicine, talk to your doctor, pharmacist, or health care provider.  2020 Elsevier/Gold Standard (2007-04-11 14:52:56) Amoxicillin; Clavulanic Acid tablets What is this medicine? AMOXICILLIN; CLAVULANIC ACID (a mox i SIL in; KLAV yoo lan ic AS id) is a penicillin antibiotic. It is used to treat certain kinds of bacterial infections. It will not work for colds, flu, or other viral infections. This medicine may be used for other purposes; ask your health care provider or pharmacist if you have questions. COMMON BRAND NAME(S): Augmentin What should I tell my health care provider before I take this medicine? They need to know if you have any of these conditions:  bowel disease, like colitis  kidney disease  liver disease  mononucleosis  an unusual or allergic reaction to amoxicillin, penicillin, cephalosporin, other antibiotics, clavulanic acid, other medicines, foods, dyes, or preservatives  pregnant or trying to get pregnant  breast-feeding How should I use this medicine? Take this medicine by mouth with a full glass of water. Follow the directions on  the prescription label. Take at the start of a meal. Do not crush or chew. If the tablet has a score line, you may cut it in half at the score line for easier swallowing. Take your medicine at regular intervals. Do not take your medicine more often than directed. Take all of your medicine as directed even if you think you are better. Do not skip doses or stop your medicine early. Talk to your pediatrician regarding the use of this medicine in children. Special care  may be needed. Overdosage: If you think you have taken too much of this medicine contact a poison control center or emergency room at once. NOTE: This medicine is only for you. Do not share this medicine with others. What if I miss a dose? If you miss a dose, take it as soon as you can. If it is almost time for your next dose, take only that dose. Do not take double or extra doses. What may interact with this medicine?  allopurinol  anticoagulants  birth control pills  methotrexate  probenecid This list may not describe all possible interactions. Give your health care provider a list of all the medicines, herbs, non-prescription drugs, or dietary supplements you use. Also tell them if you smoke, drink alcohol, or use illegal drugs. Some items may interact with your medicine. What should I watch for while using this medicine? Tell your doctor or healthcare provider if your symptoms do not improve. This medicine may cause serious skin reactions. They can happen weeks to months after starting the medicine. Contact your healthcare provider right away if you notice fevers or flu-like symptoms with a rash. The rash may be red or purple and then turn into blisters or peeling of the skin. Or, you might notice a red rash with swelling of the face, lips or lymph nodes in your neck or under your arms. Do not treat diarrhea with over the counter products. Contact your doctor if you have diarrhea that lasts more than 2 days or if it is severe and  watery. If you have diabetes, you may get a false-positive result for sugar in your urine. Check with your doctor or healthcare provider. Birth control pills may not work properly while you are taking this medicine. Talk to your doctor about using an extra method of birth control. What side effects may I notice from receiving this medicine? Side effects that you should report to your doctor or health care professional as soon as possible:  allergic reactions like skin rash, itching or hives, swelling of the face, lips, or tongue  breathing problems  dark urine  fever or chills, sore throat  redness, blistering, peeling, or loosening of the skin, including inside the mouth  seizures  trouble passing urine or change in the amount of urine  unusual bleeding, bruising  unusually weak or tired  white patches or sores in the mouth or throat Side effects that usually do not require medical attention (report to your doctor or health care professional if they continue or are bothersome):  diarrhea  dizziness  headache  nausea, vomiting  stomach upset  vaginal or anal irritation This list may not describe all possible side effects. Call your doctor for medical advice about side effects. You may report side effects to FDA at 1-800-FDA-1088. Where should I keep my medicine? Keep out of the reach of children. Store at room temperature below 25 degrees C (77 degrees F). Keep container tightly closed. Throw away any unused medicine after the expiration date. NOTE: This sheet is a summary. It may not cover all possible information. If you have questions about this medicine, talk to your doctor, pharmacist, or health care provider.  2020 Elsevier/Gold Standard (2018-03-26 09:43:46)

## 2018-12-17 NOTE — Progress Notes (Signed)
Patient: Ann [name] Female    DOB: 25-Sep-1985   33 y.o.   MRN: [id number] Visit Date: 12/17/2018  Today's Provider: Marcille Buffy, FNP   Chief Complaint  Patient presents with  . URI   Subjective:    Virtual Visit via Video Note  I connected with Ann [name] on 12/17/18 at 11:00 AM EST by a video enabled telemedicine application and verified that I am speaking with the correct person using two identifiers.  Location: Patient: Driving in her car  provider: In office at [facility].   I discussed the limitations of evaluation and management by telemedicine and the availability of in person appointments. The patient expressed understanding and agreed to proceed.   I discussed the assessment and treatment plan with the patient. The patient was provided an opportunity to ask questions and all were answered. The patient agreed with the plan and demonstrated an understanding of the instructions.   The patient was advised to call back or seek an in-person evaluation if the symptoms worsen or if the condition fails to improve as anticipated.  I provided 15 minutes of non-face-to-face time during this encounter.    URI  This is a new problem. The current episode started 1 to 4 weeks ago. The problem has been unchanged. There has been no fever. Associated symptoms include congestion, coughing (patient reports  wose at night), ear pain (right), headaches, rhinorrhea and sneezing. Pertinent negatives include no abdominal pain, chest pain, diarrhea, dysuria, joint pain, joint swelling, nausea, neck pain, plugged ear sensation, rash, sinus pain, sore throat, swollen glands, vomiting or wheezing. She has tried decongestant (Delysym, Mucinex DM and Nyquil) for the symptoms. The treatment provided mild relief.    She reports she has had post nasal drip x 3 weeks, started after she visit a funeral. She has mostly chest congestion, nasal congestion, runny  nose. Productive cough yellowish / green sputum.  She reports any fever anytime she has taken it. She denies any fever today. She has not had Nyquil or any fever reducers.  She is fatigued, however she reports she has not been sleeping well at night due to cough.   Taking allegra Mucinex DM.  Nyquil Delsym PRN  Denies any pregnancy. No LMP recorded. (Menstrual status: IUD). LMP was 2 weeks ago was normal.  She denies any pain when she breaths.  She has had two positive covid exposures at work, she reports she was not in close contact and she has had this cough prior to this exposure.    She did go to [place] for testing [facility] in [place] today 12/17/18. She said the testing was too long at [facility].   Denies any ill  Persons in her home.   Patient  denies any fever, body aches,chills, wheezing, rash, chest pain, shortness of breath, nausea, vomiting, or diarrhea.  She denies any pain in her legs, she denies any recent surgeries.  He has not had any recent labs in this office, last visit with  Alvy Beal, MD, MPH was on 03/29/2018 for otitis media.  She also was seen 04/05/2018 for dysfunction of her eustachian tube as well as a trapezius muscle spasm.Marland Kitchen  She was treated with Augmentin for 7 days at that time. She is a former smoker.     Allergies  Allergen Reactions  . Darvocet [Propoxyphene N-Acetaminophen] Nausea And Vomiting  . Latex   . Meperidine Nausea And Vomiting  Syncope     Current Outpatient Medications:  .  B COMPLEX VITAMINS PO, Take 1 tablet by mouth daily. Reported on 03/09/2015, Disp: , Rfl:  .  levonorgestrel (MIRENA, 52 MG,) 20 MCG/24HR IUD, Mirena 20 mcg/24 hours (5 yrs) 52 mg intrauterine device  Take 1 device by intrauterine route., Disp: , Rfl:  .  MULTIPLE VITAMIN PO, Take 1 tablet by mouth daily. , Disp: , Rfl:  .  Probiotic Product (PROBIOTIC DAILY PO), Take by mouth., Disp: , Rfl:  .  cyclobenzaprine (FLEXERIL) 5 MG tablet,  Take 1 tablet (5 mg total) by mouth 3 (three) times daily as needed for muscle spasms. (Patient not taking: Reported on 06/08/2018), Disp: 30 tablet, Rfl: 1 .  fluticasone (FLONASE) 50 MCG/ACT nasal spray, Place 2 sprays into both nostrils daily. (Patient not taking: Reported on 06/08/2018), Disp: 16 g, Rfl: 6 . Taking Allegra 24 hour one tablet by mouth daily. - reported by patient.  Review of Systems  Constitutional: Positive for fatigue. Negative for activity change, appetite change, chills, diaphoresis, fever and unexpected weight change.  HENT: Positive for congestion, ear pain (right), postnasal drip, rhinorrhea, sinus pressure and sneezing. Negative for dental problem, drooling, ear discharge, facial swelling, hearing loss, mouth sores, nosebleeds, sinus pain, sore throat, tinnitus, trouble swallowing and voice change.   Respiratory: Positive for cough (patient reports  wose at night) and chest tightness (Reports she feels like her chest is tight when she tries to breathe intermittently, she reports she has had this present for at least 2 weeks.  She denies any worsening.). Negative for apnea, choking, shortness of breath, wheezing and stridor.   Cardiovascular: Negative.  Negative for chest pain.       Denies any swelling.  Gastrointestinal: Negative.  Negative for abdominal pain, diarrhea, nausea and vomiting.  Genitourinary: Negative.  Negative for dysuria.  Musculoskeletal: Negative.  Negative for joint pain and neck pain.  Skin: Negative for color change and rash.  Neurological: Positive for headaches.  Psychiatric/Behavioral: Negative.     Social History   Tobacco Use  . Smoking status: Former Smoker    Quit date: 08/28/2009    Years since quitting: 9.3  . Smokeless tobacco: Never Used  Substance Use Topics  . Alcohol use: Yes      Objective:   There were no vitals taken for this visit. There were no vitals filed for this visit.There is no height or weight on file to calculate  BMI.  Reports that she has been afebrile.  No temperature has been taken today.  She is advised she should take her temperature.  No other vital signs available. Physical Exam   Patient is alert and oriented and responsive to questions Engages in conversation with provider. Speaks in full sentences without any pauses without any shortness of breath or distress.   No results found for any visits on 12/17/18.     Assessment & Plan    1. Cough This cough has been present for 3+ weeks per patient.  She did take it upon herself to go to [place] to be tested for Covid today use the [facility], but she reports she was not seen as a patient there.  Meds ordered this encounter  Medications  . amoxicillin-clavulanate (AUGMENTIN) 875-125 MG tablet    Sig: Take 1 tablet by mouth 2 (two) times daily.    Dispense:  20 tablet    Refill:  0  . fluticasone (FLONASE) 50 MCG/ACT nasal spray  Sig: Place 2 sprays into both nostrils daily.    Dispense:  16 g    Refill:  6  . predniSONE (STERAPRED UNI-PAK 21 TAB) 10 MG (21) TBPK tablet    Sig: PO: Take 6 tablets on day 1:Take 5 tablets day 2:Take 4 tablets day 3: Take 3 tablets day 4:Take 2 tablets day five: 5 Take 1 tablet day 6    Dispense:  21 tablet    Refill:  0  . benzonatate (TESSALON) 200 MG capsule    Sig: Take 1 capsule (200 mg total) by mouth at bedtime as needed for cough.    Dispense:  20 capsule    Refill:  0    2. Nose congestion Continue to use nasal spray Flonase as directed.  Renewal was sent to pharmacy.  3. Ear pain, right Given patient's history sent in Augmentin as prescribed above.  Discussed she may take ibuprofen or Tylenol per package instructions for ear pain.  He is also advised that she should follow-up with the office for routine health care, and labs when she is well.  Note will be given to follow respiratory guidelines, and will be sent to MyChart.  She is advised to quarantine until her Covid test comes  back where she is 24 hours symptom-free with no fever or fever reducing medications and all symptoms resolved.  Advised that should any of her symptoms change or worsen she should go be seen for an in person visit, due to Covid protocol we are not seeing Covid or respiratory symptoms in the office at this time a list of options was given to patient for an in person evaluation such as [place] urgent care or urgent care of her choice, emergent symptoms discussed and when to seek emergency medical care in emergency room or call 911.  Patient verbalized understanding.  The entirety of the information documented in the History of Present Illness, Review of Systems and Physical Exam were personally obtained by me. Portions of this information were initially documented by the  Certified Medical Assistant whose name is documented in Epic and reviewed by me for thoroughness and accuracy.  I have personally performed the exam and reviewed the chart and it is accurate to the best of my knowledge.  Museum/gallery conservator has been used and any errors in dictation or transcription are unintentional.  Eula Fried. Garrett Mitchum FNP-C  [facility]     Jairo Ben, FNP  [facility] [place] as a scribe for [employer] Sherby Moncayo, FNP.,have documented all relevant documentation on the behalf of Jairo Ben, FNP,as directed by  Jairo Ben, FNP while in the presence of Jairo Ben, FNP.

## 2018-12-17 NOTE — Telephone Encounter (Signed)
FYI..   Copied from [place] [id number]. Topic: General - Inquiry >> Dec 17, 2018  9:06 AM Virl Axe D wrote: Reason for CRM: Pt stated she has had congestion for 4 weeks and would like to be able to come into the office if possible for evaluation. Please advise. Scheduled for virtual at 11:0am today due to screening questions/answers.

## 2018-12-18 ENCOUNTER — Encounter: Payer: Self-pay | Admitting: Adult Health
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
## 2019-07-11 ENCOUNTER — Ambulatory Visit
Admission: RE | Admit: 2019-07-11 | Discharge: 2019-07-11 | Disposition: A | Discharge status: Home / Self Care | Payer: Managed Care, Other (non HMO) | Attending: Family Medicine | Admitting: Family Medicine

## 2019-07-11 ENCOUNTER — Encounter: Payer: Self-pay | Admitting: Family Medicine

## 2019-07-11 ENCOUNTER — Ambulatory Visit
Admission: RE | Admit: 2019-07-11 | Discharge: 2019-07-11 | Disposition: A | Discharge status: Home / Self Care | Payer: Managed Care, Other (non HMO) | Source: Ambulatory Visit | Attending: Family Medicine | Admitting: Family Medicine

## 2019-07-11 ENCOUNTER — Ambulatory Visit (INDEPENDENT_AMBULATORY_CARE_PROVIDER_SITE_OTHER): Payer: Managed Care, Other (non HMO) | Admitting: Family Medicine

## 2019-07-11 ENCOUNTER — Other Ambulatory Visit: Payer: Self-pay

## 2019-07-11 VITALS — BP 126/70 | HR 90 | Temp 97.5°F | Wt 195.0 lb

## 2019-07-11 DIAGNOSIS — M25562 Pain in left knee: Secondary | ICD-10-CM

## 2019-07-12 ENCOUNTER — Telehealth: Payer: Self-pay

## 2019-07-12 NOTE — Telephone Encounter (Signed)
-----   Message from Tamsen Roers, [place] sent at 07/12/2019 10:08 AM EDT ----- X-ray negative for fracture/bony abnormality. Proceed with treatment regimen discussed yesterday. If no better in 7-10 days, will need to schedule an orthopedic referral.

## 2019-07-12 NOTE — Telephone Encounter (Signed)
Patient advised.

## 2019-07-24 NOTE — Progress Notes (Deleted)
     Established patient visit   Patient: Ann [name]   DOB: [birth year]   34 y.o. Female  MRN: [id number] Visit Date: 07/25/2019  Today's healthcare provider: Dortha Kern, PA   No chief complaint on file.  Subjective    HPI  Follow up for Left knee pain:  The patient was last seen for this 2 weeks ago. Management during last visit include ordering a knee X-ray which was negative for fracture/bony abnormality. Recommend 3-4 tablets of Ibuprofen 200 mg TID with meals. If no better in 7-10 days, will need to schedule an orthopedic referral.  She reports {excellent/good/fair/poor:19665} compliance with treatment. She feels that condition is {improved/worse/unchanged:3041574}. She {is/is not:21021397} having side effects. ***  -----------------------------------------------------------------------------------------   {Show patient history (optional):23778::" "}   Medications: Outpatient Medications Prior to Visit  Medication Sig  . B COMPLEX VITAMINS PO Take 1 tablet by mouth daily. Reported on 03/09/2015  . cyclobenzaprine (FLEXERIL) 5 MG tablet Take 1 tablet (5 mg total) by mouth 3 (three) times daily as needed for muscle spasms. (Patient not taking: Reported on 06/08/2018)  . Levocetirizine Dihydrochloride (XYZAL ALLERGY 24HR PO) Take by mouth.  . levonorgestrel (MIRENA, 52 MG,) 20 MCG/24HR IUD Mirena 20 mcg/24 hours (5 yrs) 52 mg intrauterine device  Take 1 device by intrauterine route.  . MULTIPLE VITAMIN PO Take 1 tablet by mouth daily.    No facility-administered medications prior to visit.    Review of Systems  Constitutional: Negative for appetite change, chills, fatigue and fever.  Respiratory: Negative for chest tightness and shortness of breath.   Cardiovascular: Negative for chest pain and palpitations.  Gastrointestinal: Negative for abdominal pain, nausea and vomiting.  Neurological: Negative for dizziness and weakness.    {Heme  Chem  Endocrine   Serology  Results Review (optional):23779::" "}  Objective    LMP 06/27/2019  {Show previous vital signs (optional):23777::" "}  Physical Exam  ***  No results found for any visits on 07/25/19.  Assessment & Plan     ***  No follow-ups on file.      {provider attestation***:1}   Dortha Kern, PA  [facility] [phone] (phone) [phone] (fax)  [facility]

## 2019-07-25 ENCOUNTER — Ambulatory Visit: Payer: Managed Care, Other (non HMO) | Admitting: Family Medicine

## 2019-07-26 ENCOUNTER — Other Ambulatory Visit: Payer: Self-pay

## 2019-07-26 ENCOUNTER — Encounter: Payer: Self-pay | Admitting: Family Medicine

## 2019-07-26 ENCOUNTER — Ambulatory Visit (INDEPENDENT_AMBULATORY_CARE_PROVIDER_SITE_OTHER): Payer: Managed Care, Other (non HMO) | Admitting: Family Medicine

## 2019-07-26 VITALS — BP 118/80 | HR 86 | Temp 97.5°F | Wt 197.0 lb

## 2019-07-26 DIAGNOSIS — M25562 Pain in left knee: Secondary | ICD-10-CM | POA: Diagnosis not present
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
## 2019-09-12 ENCOUNTER — Telehealth (INDEPENDENT_AMBULATORY_CARE_PROVIDER_SITE_OTHER): Payer: Managed Care, Other (non HMO) | Admitting: Physician Assistant

## 2019-09-12 DIAGNOSIS — Z5329 Procedure and treatment not carried out because of patient's decision for other reasons: Secondary | ICD-10-CM

## 2019-09-12 NOTE — Progress Notes (Signed)
NO SHOW

## 2020-01-24 ENCOUNTER — Ambulatory Visit
Admission: EM | Admit: 2020-01-24 | Discharge: 2020-01-24 | Disposition: A | Discharge status: Home / Self Care | Payer: Managed Care, Other (non HMO) | Attending: Family Medicine | Admitting: Family Medicine

## 2020-01-24 ENCOUNTER — Other Ambulatory Visit: Payer: Self-pay

## 2020-01-24 DIAGNOSIS — J029 Acute pharyngitis, unspecified: Secondary | ICD-10-CM | POA: Diagnosis not present

## 2020-01-24 HISTORY — DX: Obesity, unspecified: E66.9

## 2020-01-24 LAB — GROUP A STREP BY PCR: Group A Strep by PCR: NOT DETECTED

## 2020-01-24 MED ORDER — LIDOCAINE VISCOUS HCL 2 % MT SOLN: OROMUCOSAL | 0 refills | Status: DC

## 2020-01-24 MED ORDER — KETOROLAC TROMETHAMINE 10 MG PO TABS: 10.0000 mg | ORAL_TABLET | Freq: Four times a day (QID) | ORAL | 0 refills | Status: DC | PRN

## 2020-01-24 NOTE — ED Triage Notes (Signed)
Pt is here with a sore throat that started Tuesday, pt has taken Advil to relieve discomfort.
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
## 2020-01-30 ENCOUNTER — Ambulatory Visit: Payer: Self-pay | Admitting: *Deleted

## 2020-01-30 ENCOUNTER — Telehealth: Payer: Self-pay

## 2020-01-30 NOTE — Telephone Encounter (Signed)
Seen in the ED on 12/31 dx with pharyngitis. Throat continues to hurt, ears are burning and lymph nodes on the sides of neck are sore. No fever. Able to swallow. Encouraged salt water gargles today, continue ibuprofen and increase daily water intake. Virtual appointment tomorrow.   Reason for Disposition . Earache also present  Answer Assessment - Initial Assessment Questions 1. ONSET: "When did the throat start hurting?" (Hours or days ago)      About one week ago 2. SEVERITY: "How bad is the sore throat?" (Scale 1-10; mild, moderate or severe)   - MILD (1-3):  doesn't interfere with eating or normal activities   - MODERATE (4-7): interferes with eating some solids and normal activities   - SEVERE (8-10):  excruciating pain, interferes with most normal activities   - SEVERE DYSPHAGIA: can't swallow liquids, drooling     Very bothersome 3. STREP EXPOSURE: "Has there been any exposure to strep within the past week?" If Yes, ask: "What type of contact occurred?"      Did not ask 4.  VIRAL SYMPTOMS: "Are there any symptoms of a cold, such as a runny nose, cough, hoarse voice or red eyes?"      Ear burning and lymph nodes behind the ears are sore 5. FEVER: "Do you have a fever?" If Yes, ask: "What is your temperature, how was it measured, and when did it start?"    No fever at all 6. PUS ON THE TONSILS: "Is there pus on the tonsils in the back of your throat?"     unsure 7. OTHER SYMPTOMS: "Do you have any other symptoms?" (e.g., difficulty breathing, headache, rash)     none 8. PREGNANCY: "Is there any chance you are pregnant?" "When was your last menstrual period?"     na  Protocols used: SORE THROAT-A-AH

## 2020-01-30 NOTE — Telephone Encounter (Signed)
Should be a Virtual only we can order strep testing for parking lot and any other testing virtual.

## 2020-01-30 NOTE — Telephone Encounter (Signed)
Copied from CRM [id number]. Topic: General - Other >> Jan 30, 2020  8:23 AM Elliot Gault wrote: Reason for CRM: patient was dx on 01/23/2021 with Viral pharyngitis, patient is scheduled with Isabella Bowens. virtually for 01/31/2020 but would like the appointment to be in office. Patient is NOT experiencing a fever, chills, lost of taste or smell, no chills, no diarrhea, only a sore throat. patient would like a morning appointment today if available. Please advise as soon as you can.

## 2020-01-31 ENCOUNTER — Encounter: Payer: Self-pay | Admitting: Adult Health

## 2020-01-31 ENCOUNTER — Telehealth (INDEPENDENT_AMBULATORY_CARE_PROVIDER_SITE_OTHER): Payer: Managed Care, Other (non HMO) | Admitting: Adult Health

## 2020-01-31 DIAGNOSIS — Z8619 Personal history of other infectious and parasitic diseases: Secondary | ICD-10-CM

## 2020-01-31 DIAGNOSIS — Z8709 Personal history of other diseases of the respiratory system: Secondary | ICD-10-CM

## 2020-01-31 NOTE — Progress Notes (Signed)
Virtual Visit via Telephone Note  I connected with Ann [name] on 01/31/20 at  1:00 PM EST by telephone and verified that I am speaking with the correct person using two identifiers. Parties involved in visit as below:    Location: Patient: at home  Provider: Provider: Provider's office at  [facility], [place].      I discussed the limitations, risks, security and privacy concerns of performing an evaluation and management service by telephone and the availability of in person appointments. I also discussed with the patient that there may be a patient responsible charge related to this service. The patient expressed understanding and agreed to proceed.   History of Present Illness:  Subjective:   Ann [name] is a 35 y.o. female who presents for evaluation of sore throat. Associated symptoms include sore throat and swollen glands. Onset of symptoms was 01/23/2020 8 days  ago, and have been rapidly improving since yesterday 01/30/2020 she reports she did  Salt water gargles and all symptoms resolved today She is not taking pain relievers now. She is drinking plenty of fluids. She has not had a recent close exposure to someone with proven streptococcal pharyngitis.No history of mono. Denies any trouble swallowing or breathing. Denies headache.   Patient  denies any fever, body aches,chills, rash, chest pain, shortness of breath, nausea, vomiting, or diarrhea.  Denies dizziness, lightheadedness, pre syncopal or syncopal episodes.    Review of Systems Pertinent items are noted in HPI.   Laboratory Strep test done. Results:negative st urgent care 01/23/21   Observations/Objective:  Patient is alert and oriented and responsive to questions Engages in conversation with provider. Speaks in full sentences without any pauses without any shortness of breath or distress.  Assessment:   Acute Pharyngitis, likely  Viral pharyngitis   Plan:Follow Up Instructions:   Acute Pharyngitis, likely H/O viral pharyngitis seems to be resolving today per patient, all of her symptoms have now resolved. There has been a outbreak of covid in her work office recommend her covid test, she declines.   Viral pharyngitis  Use of OTC analgesics recommended as well as salt water gargles. Follow up as needed. Red Flags discussed. The patient was given clear instructions to go to ER or return to medical center if any red flags develop, symptoms do not improve, worsen or new problems develop. They verbalized understanding.    I discussed the assessment and treatment plan with the patient. The patient was provided an opportunity to ask questions and all were answered. The patient agreed with the plan and demonstrated an understanding of the instructions.  I discussed the limitations of evaluation and management by telemedicine and the availability of in person appointments. The patient expressed understanding and agreed to proceed.  The patient was advised to call back or seek an in-person evaluation if the symptoms worsen or if the condition fails to improve as anticipated.  I provided 25 minutes of non-face-to-face time during this encounter.   Jairo Ben, FNP

## 2020-06-07 ENCOUNTER — Other Ambulatory Visit: Payer: Self-pay

## 2020-06-07 ENCOUNTER — Emergency Department (HOSPITAL_COMMUNITY)
Admission: EM | Admit: 2020-06-07 | Discharge: 2020-06-07 | Disposition: A | Discharge status: Home / Self Care | Payer: Managed Care, Other (non HMO) | Attending: Emergency Medicine | Admitting: Emergency Medicine

## 2020-06-07 ENCOUNTER — Emergency Department (HOSPITAL_COMMUNITY): Payer: Managed Care, Other (non HMO)

## 2020-06-07 DIAGNOSIS — Z7982 Long term (current) use of aspirin: Secondary | ICD-10-CM | POA: Insufficient documentation

## 2020-06-07 DIAGNOSIS — M722 Plantar fascial fibromatosis: Secondary | ICD-10-CM | POA: Insufficient documentation

## 2020-06-07 DIAGNOSIS — M79672 Pain in left foot: Secondary | ICD-10-CM | POA: Diagnosis present

## 2020-06-07 DIAGNOSIS — Z91040 Latex allergy status: Secondary | ICD-10-CM | POA: Insufficient documentation

## 2020-06-07 DIAGNOSIS — Z87891 Personal history of nicotine dependence: Secondary | ICD-10-CM | POA: Insufficient documentation

## 2020-06-07 NOTE — ED Triage Notes (Signed)
Patient reports playing softball Friday and injured left heel. Patient says she felt a pop. Pain is limited to heel of left foot only. Pain rated 5/10 in triage. Patient went to urgent care but says no imagining was done. Patient says her heel had been bother her for a few weeks prior to injury, but it is worse at this time.

## 2020-06-07 NOTE — ED Notes (Signed)
Pt to xray at this time.

## 2020-06-07 NOTE — Discharge Instructions (Signed)
Please follow-up with your primary care provider.  You may also follow-up with the [facility].  Rest ice and elevate your foot.  Arch support/shoe inserts can be helpful for improving plantar fasciitis pain.  Please follow-up with either an orthopedist or a podiatrist.  I given you the information for [facility].

## 2020-06-07 NOTE — ED Notes (Signed)
Pt back from xray at this time.
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
## 2020-10-29 ENCOUNTER — Ambulatory Visit (INDEPENDENT_AMBULATORY_CARE_PROVIDER_SITE_OTHER): Payer: Managed Care, Other (non HMO) | Admitting: Emergency Medicine

## 2020-10-29 ENCOUNTER — Other Ambulatory Visit: Payer: Self-pay

## 2020-10-29 ENCOUNTER — Encounter: Payer: Self-pay | Admitting: Emergency Medicine

## 2020-10-29 VITALS — BP 124/82 | HR 102 | Temp 98.8°F | Ht 63.0 in | Wt 216.0 lb

## 2020-10-29 DIAGNOSIS — Z13220 Encounter for screening for lipoid disorders: Secondary | ICD-10-CM

## 2020-10-29 DIAGNOSIS — Z1329 Encounter for screening for other suspected endocrine disorder: Secondary | ICD-10-CM

## 2020-10-29 DIAGNOSIS — Z13228 Encounter for screening for other metabolic disorders: Secondary | ICD-10-CM

## 2020-10-29 DIAGNOSIS — Z0000 Encounter for general adult medical examination without abnormal findings: Secondary | ICD-10-CM | POA: Diagnosis not present

## 2020-10-29 DIAGNOSIS — Z1159 Encounter for screening for other viral diseases: Secondary | ICD-10-CM

## 2020-10-29 DIAGNOSIS — F17200 Nicotine dependence, unspecified, uncomplicated: Secondary | ICD-10-CM

## 2020-10-29 DIAGNOSIS — Z130 Encounter for screening for diseases of the blood and blood-forming organs and certain disorders involving the immune mechanism: Secondary | ICD-10-CM

## 2020-10-29 DIAGNOSIS — F4010 Social phobia, unspecified: Secondary | ICD-10-CM

## 2020-10-29 DIAGNOSIS — R29818 Other symptoms and signs involving the nervous system: Secondary | ICD-10-CM

## 2020-10-29 DIAGNOSIS — Z872 Personal history of diseases of the skin and subcutaneous tissue: Secondary | ICD-10-CM

## 2020-10-29 LAB — CBC WITH DIFFERENTIAL/PLATELET
Basophils Absolute: 0.1 10*3/uL (ref 0.0–0.1)
Basophils Relative: 0.8 % (ref 0.0–3.0)
Eosinophils Absolute: 0.2 10*3/uL (ref 0.0–0.7)
Eosinophils Relative: 1.7 % (ref 0.0–5.0)
HCT: 37.8 % (ref 36.0–46.0)
Hemoglobin: 12.5 g/dL (ref 12.0–15.0)
Lymphocytes Relative: 25 % (ref 12.0–46.0)
Lymphs Abs: 2.2 10*3/uL (ref 0.7–4.0)
MCHC: 33.1 g/dL (ref 30.0–36.0)
MCV: 84.4 fl (ref 78.0–100.0)
Monocytes Absolute: 0.6 10*3/uL (ref 0.1–1.0)
Monocytes Relative: 7.1 % (ref 3.0–12.0)
Neutro Abs: 5.9 10*3/uL (ref 1.4–7.7)
Neutrophils Relative %: 65.4 % (ref 43.0–77.0)
Platelets: 339 10*3/uL (ref 150.0–400.0)
RBC: 4.48 Mil/uL (ref 3.87–5.11)
RDW: 12.7 % (ref 11.5–15.5)
WBC: 9 10*3/uL (ref 4.0–10.5)

## 2020-10-29 LAB — LIPID PANEL
Cholesterol: 193 mg/dL (ref 0–200)
HDL: 39.6 mg/dL (ref >39.00)
LDL Cholesterol: 119 mg/dL — ABNORMAL HIGH (ref 0–99)
NonHDL: 153.71
Total CHOL/HDL Ratio: 5
Triglycerides: 176 mg/dL — ABNORMAL HIGH (ref 0.0–149.0)
VLDL: 35.2 mg/dL (ref 0.0–40.0)

## 2020-10-29 LAB — COMPREHENSIVE METABOLIC PANEL
ALT: 21 U/L (ref 0–35)
AST: 19 U/L (ref 0–37)
Albumin: 4.5 g/dL (ref 3.5–5.2)
Alkaline Phosphatase: 64 U/L (ref 39–117)
BUN: 17 mg/dL (ref 6–23)
CO2: 27 mEq/L (ref 19–32)
Calcium: 9.5 mg/dL (ref 8.4–10.5)
Chloride: 105 mEq/L (ref 96–112)
Creatinine, Ser: 1.02 mg/dL (ref 0.40–1.20)
GFR: 71.46 mL/min (ref >60.00)
Glucose, Bld: 90 mg/dL (ref 70–99)
Potassium: 4.2 mEq/L (ref 3.5–5.1)
Sodium: 139 mEq/L (ref 135–145)
Total Bilirubin: 0.3 mg/dL (ref 0.2–1.2)
Total Protein: 7.1 g/dL (ref 6.0–8.3)

## 2020-10-29 LAB — HEMOGLOBIN A1C: Hgb A1c MFr Bld: 5.5 % (ref 4.6–6.5)

## 2020-10-29 LAB — TSH: TSH: 0.99 u[IU]/mL (ref 0.35–5.50)
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
## 2020-10-29 NOTE — Patient Instructions (Signed)
Health Maintenance, Female Adopting a healthy lifestyle and getting preventive care are important in promoting health and wellness. Ask your health care provider about: The right schedule for you to have regular tests and exams. Things you can do on your own to prevent diseases and keep yourself healthy. What should I know about diet, weight, and exercise? Eat a healthy diet  Eat a diet that includes plenty of vegetables, fruits, low-fat dairy products, and lean protein. Do not eat a lot of foods that are high in solid fats, added sugars, or sodium. Maintain a healthy weight Body mass index (BMI) is used to identify weight problems. It estimates body fat based on height and weight. Your health care provider can help determine your BMI and help you achieve or maintain a healthy weight. Get regular exercise Get regular exercise. This is one of the most important things you can do for your health. Most adults should: Exercise for at least 150 minutes each week. The exercise should increase your heart rate and make you sweat (moderate-intensity exercise). Do strengthening exercises at least twice a week. This is in addition to the moderate-intensity exercise. Spend less time sitting. Even light physical activity can be beneficial. Watch cholesterol and blood lipids Have your blood tested for lipids and cholesterol at 35 years of age, then have this test every 5 years. Have your cholesterol levels checked more often if: Your lipid or cholesterol levels are high. You are older than 35 years of age. You are at high risk for heart disease. What should I know about cancer screening? Depending on your health history and family history, you may need to have cancer screening at various ages. This may include screening for: Breast cancer. Cervical cancer. Colorectal cancer. Skin cancer. Lung cancer. What should I know about heart disease, diabetes, and high blood pressure? Blood pressure and heart  disease High blood pressure causes heart disease and increases the risk of stroke. This is more likely to develop in people who have high blood pressure readings, are of African descent, or are overweight. Have your blood pressure checked: Every 3-5 years if you are 18-39 years of age. Every year if you are 40 years old or older. Diabetes Have regular diabetes screenings. This checks your fasting blood sugar level. Have the screening done: Once every three years after age 40 if you are at a normal weight and have a low risk for diabetes. More often and at a younger age if you are overweight or have a high risk for diabetes. What should I know about preventing infection? Hepatitis B If you have a higher risk for hepatitis B, you should be screened for this virus. Talk with your health care provider to find out if you are at risk for hepatitis B infection. Hepatitis C Testing is recommended for: Everyone born from 1945 through 1965. Anyone with known risk factors for hepatitis C. Sexually transmitted infections (STIs) Get screened for STIs, including gonorrhea and chlamydia, if: You are sexually active and are younger than 35 years of age. You are older than 35 years of age and your health care provider tells you that you are at risk for this type of infection. Your sexual activity has changed since you were last screened, and you are at increased risk for chlamydia or gonorrhea. Ask your health care provider if you are at risk. Ask your health care provider about whether you are at high risk for HIV. Your health care provider may recommend a prescription medicine   to help prevent HIV infection. If you choose to take medicine to prevent HIV, you should first get tested for HIV. You should then be tested every 3 months for as long as you are taking the medicine. Pregnancy If you are about to stop having your period (premenopausal) and you may become pregnant, seek counseling before you get  pregnant. Take 400 to 800 micrograms (mcg) of folic acid every day if you become pregnant. Ask for birth control (contraception) if you want to prevent pregnancy. Osteoporosis and menopause Osteoporosis is a disease in which the bones lose minerals and strength with aging. This can result in bone fractures. If you are 65 years old or older, or if you are at risk for osteoporosis and fractures, ask your health care provider if you should: Be screened for bone loss. Take a calcium or vitamin D supplement to lower your risk of fractures. Be given hormone replacement therapy (HRT) to treat symptoms of menopause. Follow these instructions at home: Lifestyle Do not use any products that contain nicotine or tobacco, such as cigarettes, e-cigarettes, and chewing tobacco. If you need help quitting, ask your health care provider. Do not use street drugs. Do not share needles. Ask your health care provider for help if you need support or information about quitting drugs. Alcohol use Do not drink alcohol if: Your health care provider tells you not to drink. You are pregnant, may be pregnant, or are planning to become pregnant. If you drink alcohol: Limit how much you use to 0-1 drink a day. Limit intake if you are breastfeeding. Be aware of how much alcohol is in your drink. In the [place] one drink equals one 12 oz bottle of beer (355 mL), one 5 oz glass of wine (148 mL), or one 1 oz glass of hard liquor (44 mL). General instructions Schedule regular health, dental, and eye exams. Stay current with your vaccines. Tell your health care provider if: You often feel depressed. You have ever been abused or do not feel safe at home. Summary Adopting a healthy lifestyle and getting preventive care are important in promoting health and wellness. Follow your health care provider's instructions about healthy diet, exercising, and getting tested or screened for diseases. Follow your health care provider's  instructions on monitoring your cholesterol and blood pressure. This information is not intended to replace advice given to you by your health care provider. Make sure you discuss any questions you have with your health care provider. Document Revised: 03/20/2020 Document Reviewed: 01/03/2018 Elsevier Patient Education  2022 [employer] Inc.  

## 2020-10-30 LAB — HEPATITIS C ANTIBODY
Hepatitis C Ab: NONREACTIVE
SIGNAL TO CUT-OFF: 0 (ref <1.00)

## 2021-01-12 ENCOUNTER — Encounter: Payer: Self-pay | Admitting: Neurology

## 2021-01-12 ENCOUNTER — Ambulatory Visit (INDEPENDENT_AMBULATORY_CARE_PROVIDER_SITE_OTHER): Payer: Managed Care, Other (non HMO) | Admitting: Neurology

## 2021-01-12 ENCOUNTER — Other Ambulatory Visit: Payer: Self-pay

## 2021-01-12 VITALS — BP 124/85 | HR 87 | Ht 63.0 in | Wt 213.0 lb

## 2021-01-12 DIAGNOSIS — G4710 Hypersomnia, unspecified: Secondary | ICD-10-CM

## 2021-01-12 DIAGNOSIS — G4719 Other hypersomnia: Secondary | ICD-10-CM

## 2021-01-12 DIAGNOSIS — R200 Anesthesia of skin: Secondary | ICD-10-CM

## 2021-01-12 DIAGNOSIS — R0683 Snoring: Secondary | ICD-10-CM | POA: Diagnosis not present

## 2021-01-12 DIAGNOSIS — G4726 Circadian rhythm sleep disorder, shift work type: Secondary | ICD-10-CM | POA: Diagnosis not present

## 2021-01-12 DIAGNOSIS — F5104 Psychophysiologic insomnia: Secondary | ICD-10-CM

## 2021-01-12 DIAGNOSIS — G4730 Sleep apnea, unspecified: Secondary | ICD-10-CM

## 2021-01-12 DIAGNOSIS — M5410 Radiculopathy, site unspecified: Secondary | ICD-10-CM

## 2021-01-12 HISTORY — DX: Psychophysiologic insomnia: F51.04

## 2021-01-12 MED ORDER — MODAFINIL 200 MG PO TABS: 200.0000 mg | ORAL_TABLET | Freq: Every day | ORAL | 5 refills | Status: DC

## 2021-01-12 NOTE — Patient Instructions (Signed)
Modafinil tablets What is this medication? MODAFINIL (moe DAF i nil) is used to treat excessive sleepiness caused by certain sleep disorders. This includes narcolepsy, sleep apnea, and shift work sleep disorder. This medicine may be used for other purposes; ask your health care provider or pharmacist if you have questions. COMMON BRAND NAME(S): Provigil What should I tell my care team before I take this medication? They need to know if you have any of these conditions: history of depression, mania, or other mental disorder kidney disease liver disease an unusual or allergic reaction to modafinil, other medicines, foods, dyes, or preservatives pregnant or trying to get pregnant breast-feeding How should I use this medication? Take this medicine by mouth with a glass of water. Follow the directions on the prescription label. Take your doses at regular intervals. Do not take your medicine more often than directed. Do not stop taking except on your doctor's advice. A special MedGuide will be given to you by the pharmacist with each prescription and refill. Be sure to read this information carefully each time. Talk to your pediatrician regarding the use of this medicine in children. This medicine is not approved for use in children. Overdosage: If you think you have taken too much of this medicine contact a poison control center or emergency room at once. NOTE: This medicine is only for you. Do not share this medicine with others. What if I miss a dose? If you miss a dose, take it as soon as you can. If it is almost time for your next dose, take only that dose. Do not take double or extra doses. What may interact with this medication? Do not take this medicine with any of the following medications: amphetamine or dextroamphetamine dexmethylphenidate or methylphenidate medicines called MAO Inhibitors like Nardil, Parnate, Marplan, Eldepryl pemoline procarbazine This medicine may also interact  with the following medications: antifungal medicines like itraconazole or ketoconazole barbiturates like phenobarbital birth control pills or other hormone-containing birth control devices or implants carbamazepine cyclosporine diazepam medicines for depression, anxiety, or psychotic disturbances phenytoin propranolol triazolam warfarin This list may not describe all possible interactions. Give your health care provider a list of all the medicines, herbs, non-prescription drugs, or dietary supplements you use. Also tell them if you smoke, drink alcohol, or use illegal drugs. Some items may interact with your medicine. What should I watch for while using this medication? Visit your doctor or health care provider for regular checks on your progress. The full effects of this medicine may not be seen right away. This medicine may cause serious skin reactions. They can happen weeks to months after starting the medicine. Contact your health care provider right away if you notice fevers or flu-like symptoms with a rash. The rash may be red or purple and then turn into blisters or peeling of the skin. Or, you might notice a red rash with swelling of the face, lips or lymph nodes in your neck or under your arms. This medicine may affect your concentration, function, or may hide signs that you are tired. You may get dizzy. Do not drive, use machinery, or do anything that needs mental alertness until you know how this drug affects you. Alcohol can make you more dizzy and may interfere with your response to this medicine or your alertness. Avoid alcoholic drinks. Birth control pills may not work properly while you are taking this medicine. Talk to your doctor about using an extra method of birth control. It is unknown if the effects  of this medicine will be increased by the use of caffeine. Caffeine is available in many foods, beverages, and medications. Ask your doctor if you should limit or change your  intake of caffeine-containing products while on this medicine. What side effects may I notice from receiving this medication? Side effects that you should report to your doctor or health care professional as soon as possible: allergic reactions like skin rash, itching or hives, swelling of the face, lips, or tongue anxiety breathing problems chest pain fast, irregular heartbeat hallucinations increased blood pressure rash, fever, and swollen lymph nodes redness, blistering, peeling or loosening of the skin, including inside the mouth sore throat, fever, or chills suicidal thoughts or other mood changes tremors vomiting Side effects that usually do not require medical attention (report to your doctor or health care professional if they continue or are bothersome): headache nausea, diarrhea, or stomach upset nervousness trouble sleeping This list may not describe all possible side effects. Call your doctor for medical advice about side effects. You may report side effects to FDA at 1-800-FDA-1088. Where should I keep my medication? Keep out of the reach of children. This medicine can be abused. Keep your medicine in a safe place to protect it from theft. Do not share this medicine with anyone. Selling or giving away this medicine is dangerous and against the law. This medicine may cause accidental overdose and death if taken by other adults, children, or pets. Mix any unused medicine with a substance like cat litter or coffee grounds. Then throw the medicine away in a sealed container like a sealed bag or a coffee can with a lid. Do not use the medicine after the expiration date. Store at room temperature between 20 and 25 degrees C (68 and 77 degrees F). NOTE: This sheet is a summary. It may not cover all possible information. If you have questions about this medicine, talk to your doctor, pharmacist, or health care provider.  2022 Elsevier/Gold Standard (2018-04-23 00:00:00)
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
## 2021-01-12 NOTE — Addendum Note (Signed)
Addended by: Melvyn Novas on: 01/12/2021 04:10 PM   Modules accepted: Orders

## 2021-01-13 ENCOUNTER — Telehealth: Payer: Self-pay | Admitting: *Deleted

## 2021-01-13 ENCOUNTER — Telehealth: Payer: Self-pay | Admitting: Neurology

## 2021-01-13 ENCOUNTER — Telehealth: Payer: Self-pay

## 2021-01-13 LAB — BASIC METABOLIC PANEL
BUN/Creatinine Ratio: 13 (ref 9–23)
BUN: 12 mg/dL (ref 6–20)
CO2: 23 mmol/L (ref 20–29)
Calcium: 9.4 mg/dL (ref 8.7–10.2)
Chloride: 106 mmol/L (ref 96–106)
Creatinine, Ser: 0.96 mg/dL (ref 0.57–1.00)
Glucose: 79 mg/dL (ref 70–99)
Potassium: 5 mmol/L (ref 3.5–5.2)
Sodium: 142 mmol/L (ref 134–144)
eGFR: 79 mL/min/{1.73_m2} (ref >59)

## 2021-01-13 NOTE — Telephone Encounter (Signed)
I have submitted a PA for modafinil on CMM, KeyLaqueta Linden - PA Case ID: [id number]. Awaiting determination

## 2021-01-13 NOTE — Progress Notes (Signed)
Normal basic metabolic lab panel

## 2021-01-13 NOTE — Telephone Encounter (Signed)
cigna order sent to [facility], they will obtain the auth and reach out to the patient to schedule.  ?

## 2021-01-13 NOTE — Telephone Encounter (Signed)
-----   Message from Melvyn Novas, MD sent at 01/13/2021 12:09 PM EST ----- Normal basic metabolic lab panel

## 2021-01-13 NOTE — Telephone Encounter (Signed)
Called and spoke w/ pt about results per Dr. Dohmeier's note. Pt verbalized understanding.  

## 2021-01-19 ENCOUNTER — Other Ambulatory Visit: Payer: Managed Care, Other (non HMO)

## 2021-01-20 ENCOUNTER — Encounter: Payer: Self-pay | Admitting: Neurology

## 2021-01-20 ENCOUNTER — Other Ambulatory Visit: Payer: Managed Care, Other (non HMO)

## 2021-01-20 ENCOUNTER — Telehealth: Payer: Self-pay | Admitting: Neurology

## 2021-01-20 NOTE — Telephone Encounter (Signed)
Phone note entered in error. Please disregard 

## 2021-01-20 NOTE — Telephone Encounter (Signed)
[employer] did not approve the MRI Cervical spine.   "Imaging requires six weeks of provider directed treatment to be completed. Supported treatments include (but are not limited to) drugs for swelling or pain, an in office workout (physical therapy), and/or oral or injected steroids. This must have been completed in the past three months without improved symptoms. Contact (via office visit, phone, email, or messaging) must occur after the treatment is completed. This has not been met because:"   There is an option to do a peer to peer. The phone number is [phone]. The case number is [id number]. There is no deadline for the peer to peer to be done. It would just need to be called to be scheduled.
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
## 2021-01-21 ENCOUNTER — Other Ambulatory Visit: Payer: Self-pay | Admitting: Neurology

## 2021-01-21 DIAGNOSIS — R200 Anesthesia of skin: Secondary | ICD-10-CM

## 2021-01-21 DIAGNOSIS — M5410 Radiculopathy, site unspecified: Secondary | ICD-10-CM

## 2021-01-22 ENCOUNTER — Telehealth: Payer: Self-pay | Admitting: Neurology

## 2021-01-22 NOTE — Telephone Encounter (Signed)
Maritsa from [facility] called stating that the pt's modafinil (PROVIGIL) 200 MG tablet has been denied.

## 2021-01-26 NOTE — Telephone Encounter (Signed)
We are already aware of this and advised pt to use goodrx coupon. See other message.

## 2021-01-29 ENCOUNTER — Other Ambulatory Visit: Payer: Self-pay | Admitting: *Deleted

## 2021-02-01 MED ORDER — MODAFINIL 200 MG PO TABS: 200.0000 mg | ORAL_TABLET | Freq: Every day | ORAL | 5 refills | Status: DC

## 2021-02-01 NOTE — Progress Notes (Signed)
Refilled Modafinil, 30 tabs, 5 refills.

## 2021-02-24 ENCOUNTER — Ambulatory Visit (INDEPENDENT_AMBULATORY_CARE_PROVIDER_SITE_OTHER): Payer: Managed Care, Other (non HMO) | Admitting: Neurology

## 2021-02-24 DIAGNOSIS — G4733 Obstructive sleep apnea (adult) (pediatric): Secondary | ICD-10-CM

## 2021-02-24 DIAGNOSIS — G4730 Sleep apnea, unspecified: Secondary | ICD-10-CM

## 2021-02-24 DIAGNOSIS — G4726 Circadian rhythm sleep disorder, shift work type: Secondary | ICD-10-CM

## 2021-02-24 DIAGNOSIS — F5104 Psychophysiologic insomnia: Secondary | ICD-10-CM

## 2021-02-24 DIAGNOSIS — G4710 Hypersomnia, unspecified: Secondary | ICD-10-CM

## 2021-02-24 DIAGNOSIS — R0683 Snoring: Secondary | ICD-10-CM

## 2021-03-01 NOTE — Progress Notes (Signed)
°  °  °[facility] °  °HOME SLEEP TEST REPORT ( by Watch PAT)   °STUDY DATE:  data loaded 03-01-2021 ° °  °ORDERING CLINICIAN: Carmen Dohmeier, MD ° °REFERRING CLINICIAN: Dr Sagardia, Miguel Jose, Md °[address] °[place],  [place] [address]  °  °CLINICAL INFORMATION/HISTORY: 01-12-2022:Pt reports having issues falling and staying asleep. Pt has occasionally woken herself up from loud snoring. Takes melatonin on some nights. Wakes up middle of night and can takes seconds to over an hour to fall back asleep. Husband has noticed at nights when she is not breathing.  ° °Epworth sleepiness score: 12/24. °  °BMI: 37.9 kg/m² °  °Neck Circumference: 15" °  °FINDINGS: °  °Sleep Summary: °  °Total Recording Time (hours, min): Total recording time amounted to 6 hours 59 minutes of which 5 hours and 59 minutes was a total calculated sleep time.  12% of sleep time were REM sleep.    °                        °  °Respiratory Indices: °  °Calculated pAHI (per hour):    1.6/h                       °  °REM pAHI: 4.2/h                                             °  °NREM pAHI: 1.2/h                          °  °Positional AHI: Interestingly this patient had no apneas or hypopneas in supine sleep.  Sleeping on her left side an AHI of 6.7 was established and sleeping prone of 2.3/h. ° °Snoring data show a mean volume of 41 dB and only 10% of total sleep time was accompanied by snoring.                                                 °  °Oxygen Saturation Statistics:   °  °O2 Saturation Range (%): Varied between 94% and a maximum of 100% with a mean value of 96%.                                   °  °O2 Saturation (minutes) <89%: 0 minutes       °  °Pulse Rate Statistics: °  °Pulse Range:   Varied between 58 and 118 bpm with a mean heart rate of 78 bpm            °  °IMPRESSION:  This HST found no evidence of clinically significant sleep apnea, hypoxia, or heart rate abnormality.  It does seem to help if the patient sleeps  on her right side where she had no apnea and snoring versus her left side .   ° °RECOMMENDATION: There is no intervention necessary. ° °  °INTERPRETING PHYSICIAN: ° ° Carmen Dohmeier, MD  ° °Medical Director of [facility].  ° ° ° ° ° ° ° ° ° ° ° ° ° ° ° ° ° °

## 2021-03-17 ENCOUNTER — Encounter (INDEPENDENT_AMBULATORY_CARE_PROVIDER_SITE_OTHER): Payer: Managed Care, Other (non HMO) | Admitting: Neurology

## 2021-03-17 ENCOUNTER — Encounter: Payer: Self-pay | Admitting: Neurology

## 2021-03-17 ENCOUNTER — Ambulatory Visit (INDEPENDENT_AMBULATORY_CARE_PROVIDER_SITE_OTHER): Payer: Managed Care, Other (non HMO) | Admitting: Neurology

## 2021-03-17 DIAGNOSIS — R200 Anesthesia of skin: Secondary | ICD-10-CM | POA: Diagnosis not present

## 2021-03-17 DIAGNOSIS — M5410 Radiculopathy, site unspecified: Secondary | ICD-10-CM

## 2021-03-17 DIAGNOSIS — Z0289 Encounter for other administrative examinations: Secondary | ICD-10-CM

## 2021-03-17 DIAGNOSIS — G5603 Carpal tunnel syndrome, bilateral upper limbs: Secondary | ICD-10-CM

## 2021-03-17 HISTORY — DX: Carpal tunnel syndrome, bilateral upper limbs: G56.03

## 2021-03-17 NOTE — Procedures (Signed)
Full Name: Ann [name] Gender: Female MRN #: [id number] Date of Birth: Mar 28, 1985    Visit Date: 03/17/2021 07:09 Age: 36 Years Examining Physician: Levert Feinstein, MD  Referring Physician: Melvyn Novas, MD Height: 5 feet 3 inch Patient History: 36 year old right-handed female, complains gradual onset intermittent right hand paresthesia On examination: Bilateral upper extremity including abductor pollicis brevis, opponens was normal.  Mildly decreased to pinprick at right first before fingerpads  Summary of the test: Nerve conduction study: Right median sensory response showed moderately prolonged peak latency within normal range snap amplitude.  Left median sensory response showed borderline prolonged peak latency, with normal snap amplitude.  Left median mixed response was 0.4 ms prolonged compared to right side.  Bilateral ulnar sensory and motor responses were normal.  Bilateral median motor response showed upper limit of normal distal latency, with normal CMAP amplitude, conduction velocity.  Electromyography: Selected needle examination was performed at right upper extremity, right cervical paraspinal muscles.  The only abnormality is mildly decreased recruitment patterns with mildly large motor unit potentials in right abductor pollicis brevis.   Conclusion: This is an abnormal study.  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  There is no evidence of axonal loss.  There is no evidence of right cervical radiculopathy.  I have suggested her wrist splint.    ------------------------------- Levert Feinstein M.D. Ph.D.  [facility] [address], [address] [place], [place] [address] Tel: [phone] Fax: [phone]  Verbal informed consent was obtained from the patient, patient was informed of potential risk of procedure, including bruising,  bleeding, hematoma formation, infection, muscle weakness, muscle pain, numbness, among others.        MNC    Nerve / Sites Muscle Latency Ref. Amplitude Ref. Rel Amp Segments Distance Velocity Ref. Area    ms ms mV mV %  cm m/s m/s mVms  R Median - APB     Wrist APB 4.4 ?4.4 8.8 ?4.0 100 Wrist - APB 7   29.2     Upper arm APB 8.1  8.6  98.1 Upper arm - Wrist 19 52 ?49 29.7  L Median - APB     Wrist APB 4.4 ?4.4 9.0 ?4.0 100 Wrist - APB 7   29.5     Upper arm APB 7.8  7.8  86.8 Upper arm - Wrist 20 60 ?49 26.3  R Ulnar - ADM     Wrist ADM 2.5 ?3.3 9.4 ?6.0 100 Wrist - ADM 7   33.2     B.Elbow ADM 5.4  9.3  98.9 B.Elbow - Wrist 17 57 ?49 32.1     A.Elbow ADM 7.3  9.3  99.8 A.Elbow - B.Elbow 10 53 ?49 31.7  L Ulnar - ADM     Wrist ADM 3.1 ?3.3 9.3 ?6.0 100 Wrist - ADM 7   38.9     B.Elbow ADM 5.9  9.1  98.5 B.Elbow - Wrist 17 61 ?49 38.0     A.Elbow ADM 7.6  9.1  100 A.Elbow - B.Elbow 10 59 ?49 37.8             SNC    Nerve / Sites Rec. Site Peak Lat Ref.  Amp Ref. Segments Distance Peak Diff Ref.    ms ms V V  cm ms ms  L Median, Ulnar - Transcarpal comparison     Median [employer]  Wrist 2.4 ?2.2 70 ?35 Median Palm - Wrist 8       Ulnar Palm Wrist 2.0 ?2.2 13 ?12 Ulnar Palm - Wrist 8          Median Palm - Ulnar Palm  0.4 ?0.4  R Median - Orthodromic (Dig II, Mid palm)     Dig II Wrist 4.1 ?3.4 12 ?10 Dig II - Wrist 13    L Median - Orthodromic (Dig II, Mid palm)     Dig II Wrist 3.4 ?3.4 18 ?10 Dig II - Wrist 13    R Ulnar - Orthodromic, (Dig V, Mid palm)     Dig V Wrist 2.5 ?3.1 7 ?5 Dig V - Wrist 11    L Ulnar - Orthodromic, (Dig V, Mid palm)     Dig V Wrist 2.8 ?3.1 7 ?5 Dig V - Wrist 49                 F  Wave    Nerve F Lat Ref.   ms ms  R Ulnar - ADM 25.1 ?32.0  L Ulnar - ADM 26.7 ?32.0         EMG Summary Table    Spontaneous MUAP Recruitment  Muscle IA Fib PSW Fasc Other Amp Dur. Poly Pattern  R. First dorsal interosseous Normal None None None _______ Normal  Normal Normal Normal  R. Abductor pollicis brevis Normal None None None _______ Normal Normal Normal Reduced  R. Pronator teres Normal None None None _______ Normal Normal Normal Normal  R. Brachioradialis Normal None None None _______ Normal Normal Normal Normal  R. Biceps brachii Normal None None None _______ Normal Normal Normal Normal  R. Deltoid Normal None None None _______ Normal Normal Normal Normal  R. Flexor digitorum profundus (Ulnar) Normal None None None _______ Normal Normal Normal Normal

## 2021-03-18 NOTE — Progress Notes (Signed)
Symptom : Hand paraesthesia. Conclusion: This is an abnormal study.  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   There is no evidence of axonal loss.  There is no evidence of right cervical radiculopathy.  I suggested her to use a soft wrist splint.    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

## 2021-03-19 ENCOUNTER — Telehealth: Payer: Self-pay | Admitting: Emergency Medicine

## 2021-03-19 NOTE — Procedures (Signed)
[facility] TEST REPORT ( by Watch PAT)   STUDY DATE:  data loaded 03-01-2021    ORDERING CLINICIAN: Larey Seat, MD  REFERRING CLINICIAN: Dr Horald Pollen, [place],  [place] [id number]    CLINICAL INFORMATION/HISTORY: 01-12-2022:Pt reports having issues falling and staying asleep. Pt has occasionally woken herself up from loud snoring. Takes melatonin on some nights. Wakes up middle of night and can takes seconds to over an hour to fall back asleep. Husband has noticed at nights when she is not breathing.   Epworth sleepiness score: 12/24.   BMI: 37.9 kg/m   Neck Circumference: 15"   FINDINGS:   Sleep Summary:   Total Recording Time (hours, min): Total recording time amounted to 6 hours 59 minutes of which 5 hours and 59 minutes was a total calculated sleep time.  12% of sleep time were REM sleep.                              Respiratory Indices:   Calculated pAHI (per hour):    1.6/h                         REM pAHI: 4.2/h                                               NREM pAHI: 1.2/h                            Positional AHI: Interestingly this patient had no apneas or hypopneas in supine sleep.  Sleeping on her left side an AHI of 6.7 was established and sleeping prone of 2.3/h.  Snoring data show a mean volume of 41 dB and only 10% of total sleep time was accompanied by snoring.                                                   Oxygen Saturation Statistics:     O2 Saturation Range (%): Varied between 94% and a maximum of 100% with a mean value of 96%.                                     O2 Saturation (minutes) <89%: 0 minutes         Pulse Rate Statistics:   Pulse Range:   Varied between 58 and 118 bpm with a mean heart rate of 78 bpm              IMPRESSION:  This HST found no evidence of clinically significant sleep apnea, hypoxia, or heart rate abnormality.  It does seem to help if the patient sleeps  on her right side where she had no apnea and snoring versus her left side .    RECOMMENDATION: There is no intervention necessary.    INTERPRETING PHYSICIAN:   Larey Seat, MD   Medical Director of [facility].

## 2021-03-19 NOTE — Progress Notes (Signed)
IMPRESSION:  This HST found no evidence of clinically significant sleep apnea, hypoxia, or heart rate abnormality.  It does seem to help if the patient sleeps on her right side where she had no apnea and snoring versus her left side .    RECOMMENDATION: There is no intervention necessary. No follow up in the sleep clinic is needed.

## 2021-03-22 ENCOUNTER — Encounter: Payer: Self-pay | Admitting: Neurology

## 2021-07-29 ENCOUNTER — Encounter: Payer: Self-pay | Admitting: Emergency Medicine

## 2021-07-29 ENCOUNTER — Ambulatory Visit (INDEPENDENT_AMBULATORY_CARE_PROVIDER_SITE_OTHER): Payer: Managed Care, Other (non HMO) | Admitting: Emergency Medicine

## 2021-07-29 ENCOUNTER — Telehealth: Payer: Self-pay | Admitting: *Deleted

## 2021-07-29 VITALS — BP 120/74 | HR 95 | Temp 98.7°F | Ht 63.0 in | Wt 221.4 lb

## 2021-07-29 DIAGNOSIS — E6609 Other obesity due to excess calories: Secondary | ICD-10-CM

## 2021-07-29 MED ORDER — WEGOVY 1 MG/0.5ML ~~LOC~~ SOAJ: 1.0000 mg | SUBCUTANEOUS | 7 refills | Status: DC

## 2021-07-29 NOTE — Telephone Encounter (Signed)
Rec'd determination med was APPROVED. It states This request has received a Favorable outcome. Effective 07/29/21 through 03/01/2022.Faxing approval to [email or web address] [email or web address] [email or web address]

## 2021-07-29 NOTE — Assessment & Plan Note (Signed)
Diet and nutrition discussed. Advised to decrease amount of daily carbohydrate intake and daily calories. Benefit of exercise discussed. May benefit from weekly Wegovy Follow-up in 3 months.

## 2021-07-29 NOTE — Patient Instructions (Signed)
Calorie Counting for Weight Loss Calories are units of energy. Your body needs a certain number of calories from food to keep going throughout the day. When you eat or drink more calories than your body needs, your body stores the extra calories mostly as fat. When you eat or drink fewer calories than your body needs, your body burns fat to get the energy it needs. Calorie counting means keeping track of how many calories you eat and drink each day. Calorie counting can be helpful if you need to lose weight. If you eat fewer calories than your body needs, you should lose weight. Ask your health care provider what a healthy weight is for you. For calorie counting to work, you will need to eat the right number of calories each day to lose a healthy amount of weight per week. A dietitian can help you figure out how many calories you need in a day and will suggest ways to reach your calorie goal. A healthy amount of weight to lose each week is usually 1-2 lb (0.5-0.9 kg). This usually means that your daily calorie intake should be reduced by 500-750 calories. Eating 1,200-1,500 calories a day can help most women lose weight. Eating 1,500-1,800 calories a day can help most men lose weight. What do I need to know about calorie counting? Work with your health care provider or dietitian to determine how many calories you should get each day. To meet your daily calorie goal, you will need to: Find out how many calories are in each food that you would like to eat. Try to do this before you eat. Decide how much of the food you plan to eat. Keep a food log. Do this by writing down what you ate and how many calories it had. To successfully lose weight, it is important to balance calorie counting with a healthy lifestyle that includes regular activity. Where do I find calorie information?  The number of calories in a food can be found on a Nutrition Facts label. If a food does not have a Nutrition Facts label, try  to look up the calories online or ask your dietitian for help. Remember that calories are listed per serving. If you choose to have more than one serving of a food, you will have to multiply the calories per serving by the number of servings you plan to eat. For example, the label on a package of bread might say that a serving size is 1 slice and that there are 90 calories in a serving. If you eat 1 slice, you will have eaten 90 calories. If you eat 2 slices, you will have eaten 180 calories. How do I keep a food log? After each time that you eat, record the following in your food log as soon as possible: What you ate. Be sure to include toppings, sauces, and other extras on the food. How much you ate. This can be measured in cups, ounces, or number of items. How many calories were in each food and drink. The total number of calories in the food you ate. Keep your food log near you, such as in a pocket-sized notebook or on an app or website on your mobile phone. Some programs will calculate calories for you and show you how many calories you have left to meet your daily goal. What are some portion-control tips? Know how many calories are in a serving. This will help you know how many servings you can have of a certain   food. Use a measuring cup to measure serving sizes. You could also try weighing out portions on a kitchen scale. With time, you will be able to estimate serving sizes for some foods. Take time to put servings of different foods on your favorite plates or in your favorite bowls and cups so you know what a serving looks like. Try not to eat straight from a food's packaging, such as from a bag or box. Eating straight from the package makes it hard to see how much you are eating and can lead to overeating. Put the amount you would like to eat in a cup or on a plate to make sure you are eating the right portion. Use smaller plates, glasses, and bowls for smaller portions and to prevent  overeating. Try not to multitask. For example, avoid watching TV or using your computer while eating. If it is time to eat, sit down at a table and enjoy your food. This will help you recognize when you are full. It will also help you be more mindful of what and how much you are eating. What are tips for following this plan? Reading food labels Check the calorie count compared with the serving size. The serving size may be smaller than what you are used to eating. Check the source of the calories. Try to choose foods that are high in protein, fiber, and vitamins, and low in saturated fat, trans fat, and sodium. Shopping Read nutrition labels while you shop. This will help you make healthy decisions about which foods to buy. Pay attention to nutrition labels for low-fat or fat-free foods. These foods sometimes have the same number of calories or more calories than the full-fat versions. They also often have added sugar, starch, or salt to make up for flavor that was removed with the fat. Make a grocery list of lower-calorie foods and stick to it. Cooking Try to cook your favorite foods in a healthier way. For example, try baking instead of frying. Use low-fat dairy products. Meal planning Use more fruits and vegetables. One-half of your plate should be fruits and vegetables. Include lean proteins, such as chicken, turkey, and fish. Lifestyle Each week, aim to do one of the following: 150 minutes of moderate exercise, such as walking. 75 minutes of vigorous exercise, such as running. General information Know how many calories are in the foods you eat most often. This will help you calculate calorie counts faster. Find a way of tracking calories that works for you. Get creative. Try different apps or programs if writing down calories does not work for you. What foods should I eat?  Eat nutritious foods. It is better to have a nutritious, high-calorie food, such as an avocado, than a food with  few nutrients, such as a bag of potato chips. Use your calories on foods and drinks that will fill you up and will not leave you hungry soon after eating. Examples of foods that fill you up are nuts and nut butters, vegetables, lean proteins, and high-fiber foods such as whole grains. High-fiber foods are foods with more than 5 g of fiber per serving. Pay attention to calories in drinks. Low-calorie drinks include water and unsweetened drinks. The items listed above may not be a complete list of foods and beverages you can eat. Contact a dietitian for more information. What foods should I limit? Limit foods or drinks that are not good sources of vitamins, minerals, or protein or that are high in unhealthy fats. These   include: Candy. Other sweets. Sodas, specialty coffee drinks, alcohol, and juice. The items listed above may not be a complete list of foods and beverages you should avoid. Contact a dietitian for more information. How do I count calories when eating out? Pay attention to portions. Often, portions are much larger when eating out. Try these tips to keep portions smaller: Consider sharing a meal instead of getting your own. If you get your own meal, eat only half of it. Before you start eating, ask for a container and put half of your meal into it. When available, consider ordering smaller portions from the menu instead of full portions. Pay attention to your food and drink choices. Knowing the way food is cooked and what is included with the meal can help you eat fewer calories. If calories are listed on the menu, choose the lower-calorie options. Choose dishes that include vegetables, fruits, whole grains, low-fat dairy products, and lean proteins. Choose items that are boiled, broiled, grilled, or steamed. Avoid items that are buttered, battered, fried, or served with cream sauce. Items labeled as crispy are usually fried, unless stated otherwise. Choose water, low-fat milk,  unsweetened iced tea, or other drinks without added sugar. If you want an alcoholic beverage, choose a lower-calorie option, such as a glass of wine or light beer. Ask for dressings, sauces, and syrups on the side. These are usually high in calories, so you should limit the amount you eat. If you want a salad, choose a garden salad and ask for grilled meats. Avoid extra toppings such as bacon, cheese, or fried items. Ask for the dressing on the side, or ask for olive oil and vinegar or lemon to use as dressing. Estimate how many servings of a food you are given. Knowing serving sizes will help you be aware of how much food you are eating at restaurants. Where to find more information Centers for Disease Control and Prevention: www.cdc.gov U.[place]. Department of Agriculture: myplate.gov Summary Calorie counting means keeping track of how many calories you eat and drink each day. If you eat fewer calories than your body needs, you should lose weight. A healthy amount of weight to lose per week is usually 1-2 lb (0.5-0.9 kg). This usually means reducing your daily calorie intake by 500-750 calories. The number of calories in a food can be found on a Nutrition Facts label. If a food does not have a Nutrition Facts label, try to look up the calories online or ask your dietitian for help. Use smaller plates, glasses, and bowls for smaller portions and to prevent overeating. Use your calories on foods and drinks that will fill you up and not leave you hungry shortly after a meal. This information is not intended to replace advice given to you by your health care provider. Make sure you discuss any questions you have with your health care provider. Document Revised: 02/21/2019 Document Reviewed: 02/21/2019 Elsevier Patient Education  2023 [employer]  
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
## 2021-07-29 NOTE — Telephone Encounter (Signed)
Pt was on cover-my-meds need PA on Wegovy. Submitted PA w/  (Key: [id number]). Rec'd msg [employer] is reviewing your PA request. Typically an electronic response will be received within 24-72 hours.Marland KitchenRaechel Chute

## 2021-07-29 NOTE — Telephone Encounter (Signed)
Thank you :)

## 2021-08-03 ENCOUNTER — Ambulatory Visit: Payer: Managed Care, Other (non HMO) | Admitting: Emergency Medicine

## 2021-08-05 ENCOUNTER — Encounter: Payer: Self-pay | Admitting: Emergency Medicine

## 2021-08-06 NOTE — Telephone Encounter (Signed)
1.7 is a high dose she may not tolerate. You start with a low dose to assess tolerance and presence/absence of side effects. Thanks.

## 2021-08-10 ENCOUNTER — Other Ambulatory Visit (HOSPITAL_COMMUNITY): Payer: Self-pay

## 2021-08-10 ENCOUNTER — Other Ambulatory Visit (HOSPITAL_BASED_OUTPATIENT_CLINIC_OR_DEPARTMENT_OTHER): Payer: Self-pay

## 2021-08-10 MED ORDER — SEMAGLUTIDE-WEIGHT MANAGEMENT 1 MG/0.5ML ~~LOC~~ SOAJ
1.0000 mg | SUBCUTANEOUS | 7 refills | Status: DC
  Filled 2021-08-11: qty 2, 28d supply, fill #0
  Filled 2021-09-05: qty 2, 28d supply, fill #1
  Filled 2021-09-23 – 2021-09-28 (×2): qty 2, 28d supply, fill #2
  Filled 2021-11-10: qty 2, 28d supply, fill #3

## 2021-08-11 ENCOUNTER — Other Ambulatory Visit (HOSPITAL_COMMUNITY): Payer: Self-pay

## 2021-08-11 ENCOUNTER — Other Ambulatory Visit (HOSPITAL_BASED_OUTPATIENT_CLINIC_OR_DEPARTMENT_OTHER): Payer: Self-pay

## 2021-08-22 ENCOUNTER — Other Ambulatory Visit: Payer: Self-pay | Admitting: Neurology

## 2021-09-07 ENCOUNTER — Other Ambulatory Visit (HOSPITAL_BASED_OUTPATIENT_CLINIC_OR_DEPARTMENT_OTHER): Payer: Self-pay

## 2021-09-23 ENCOUNTER — Other Ambulatory Visit (HOSPITAL_BASED_OUTPATIENT_CLINIC_OR_DEPARTMENT_OTHER): Payer: Self-pay

## 2021-09-28 ENCOUNTER — Other Ambulatory Visit (HOSPITAL_BASED_OUTPATIENT_CLINIC_OR_DEPARTMENT_OTHER): Payer: Self-pay

## 2021-10-08 ENCOUNTER — Other Ambulatory Visit (HOSPITAL_BASED_OUTPATIENT_CLINIC_OR_DEPARTMENT_OTHER): Payer: Self-pay

## 2021-11-11 ENCOUNTER — Telehealth: Payer: Self-pay | Admitting: *Deleted

## 2021-11-11 ENCOUNTER — Other Ambulatory Visit (HOSPITAL_BASED_OUTPATIENT_CLINIC_OR_DEPARTMENT_OTHER): Payer: Self-pay

## 2021-11-11 NOTE — Telephone Encounter (Signed)
PA for [facility] submitted, awaiting response Key: [id number]

## 2021-11-11 NOTE — Telephone Encounter (Signed)
PA for [facility] denied,  Appeal has been started

## 2021-11-15 ENCOUNTER — Other Ambulatory Visit (HOSPITAL_BASED_OUTPATIENT_CLINIC_OR_DEPARTMENT_OTHER): Payer: Self-pay

## 2021-11-15 ENCOUNTER — Other Ambulatory Visit: Payer: Self-pay | Admitting: Emergency Medicine

## 2021-11-16 NOTE — Telephone Encounter (Signed)
This needs to be refilled by original prescriber.

## 2021-11-18 ENCOUNTER — Other Ambulatory Visit (HOSPITAL_BASED_OUTPATIENT_CLINIC_OR_DEPARTMENT_OTHER): Payer: Self-pay

## 2021-11-19 ENCOUNTER — Other Ambulatory Visit (HOSPITAL_BASED_OUTPATIENT_CLINIC_OR_DEPARTMENT_OTHER): Payer: Self-pay

## 2021-11-23 NOTE — Telephone Encounter (Signed)
Appeal for Ann [name] has been approved. Patient notified

## 2021-11-25 ENCOUNTER — Other Ambulatory Visit (HOSPITAL_BASED_OUTPATIENT_CLINIC_OR_DEPARTMENT_OTHER): Payer: Self-pay

## 2021-11-25 NOTE — Telephone Encounter (Signed)
Pt called to report pharmacy is still not able to run the [facility] through insurance to fill. Prior Authorization was obtained per previous note and patient made aware. Please advise pt what the problem is and also contact pharmacy to fix the problem.

## 2021-11-25 NOTE — Telephone Encounter (Signed)
Called patient and informed her that her new insurance company [facility] doesn't cover [phone]. Her PA and appeal was sent in to [facility]. Patient switched insurance. Patient will call [facility] and get approved alternatives

## 2021-11-26 ENCOUNTER — Other Ambulatory Visit (HOSPITAL_BASED_OUTPATIENT_CLINIC_OR_DEPARTMENT_OTHER): Payer: Self-pay

## 2021-12-14 NOTE — Telephone Encounter (Signed)
Mounjaro 5 mg weekly is okay to use.

## 2021-12-15 NOTE — Telephone Encounter (Signed)
Maybe.  Depends on response and possible side effects.

## 2021-12-20 ENCOUNTER — Telehealth: Payer: Self-pay | Admitting: *Deleted

## 2021-12-20 MED ORDER — TIRZEPATIDE 5 MG/0.5ML ~~LOC~~ SOAJ: 5.0000 mg | SUBCUTANEOUS | 2 refills | Status: DC

## 2021-12-20 NOTE — Addendum Note (Signed)
Addended by: Jerrell Belfast on: 12/20/2021 11:32 AM   Modules accepted: Orders

## 2021-12-20 NOTE — Telephone Encounter (Signed)
PA for [facility] submitted, awaiting response Key:  [id number]

## 2021-12-21 NOTE — Telephone Encounter (Signed)
PA Mounjaro denied due patient not being Type 2 diabetic. Patient notified by [facility]

## 2022-01-24 IMAGING — CR DG FOOT COMPLETE 3+V*L*
3 series · 3 of 3 positions shown · non-contrast
Comparison: None.

CLINICAL DATA: Pain.  Injury.  Softball injury.

EXAM:
LEFT FOOT - COMPLETE 3+ VIEW

[x foot ap left]
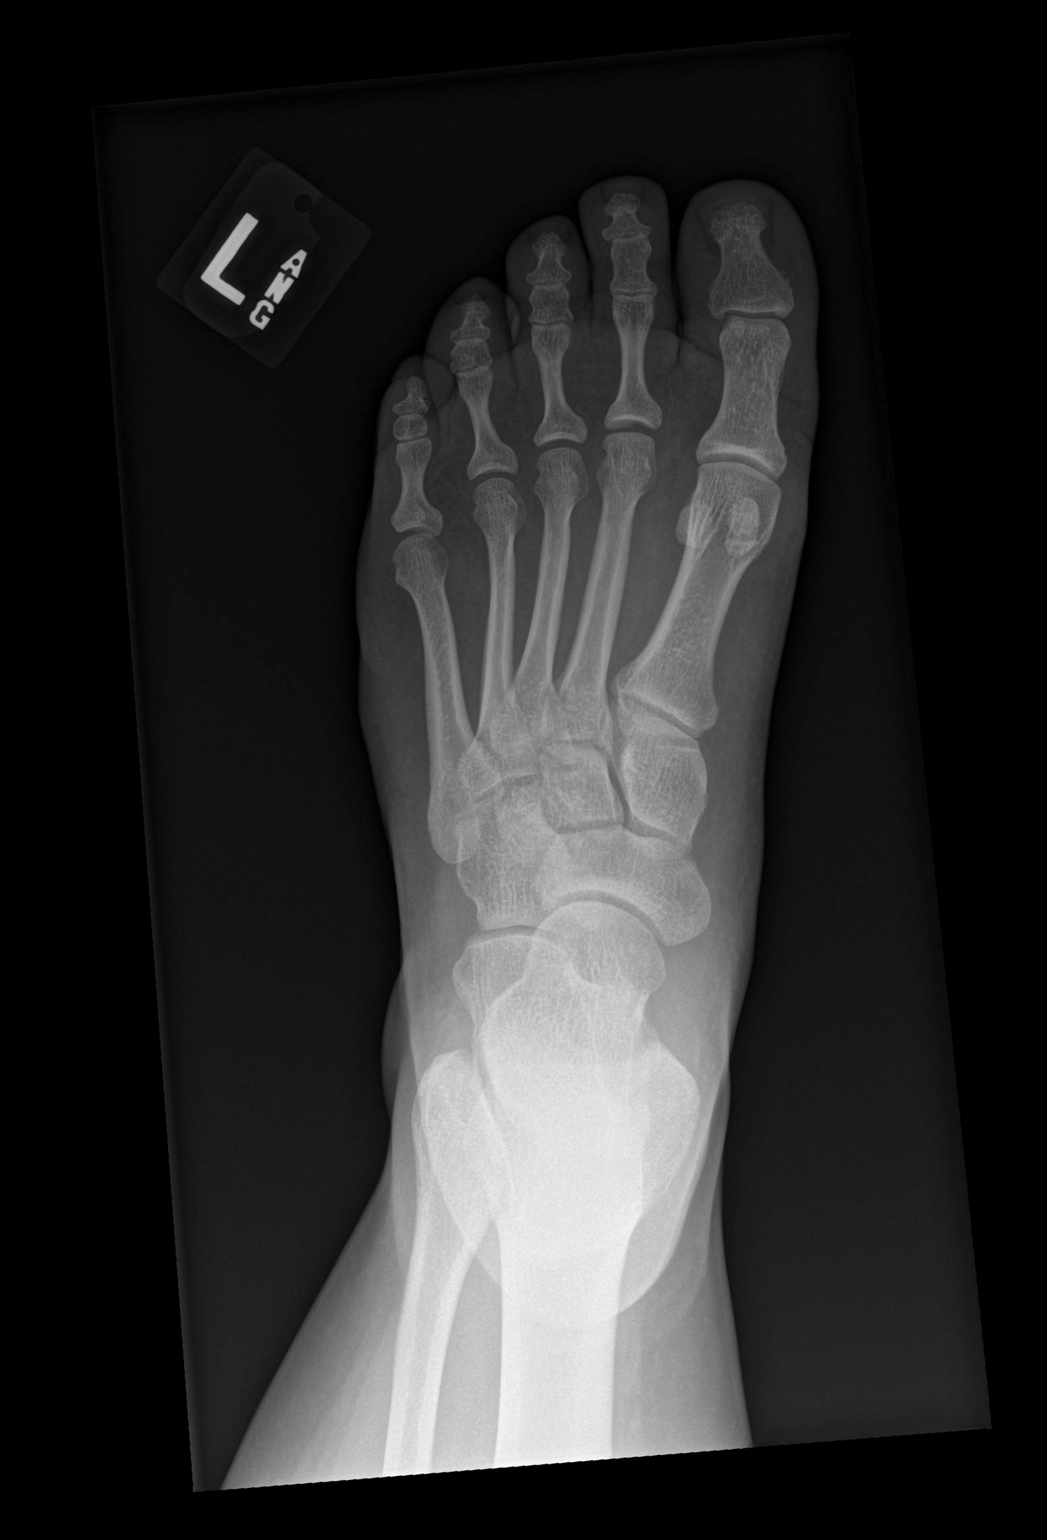

[x foot obl left]
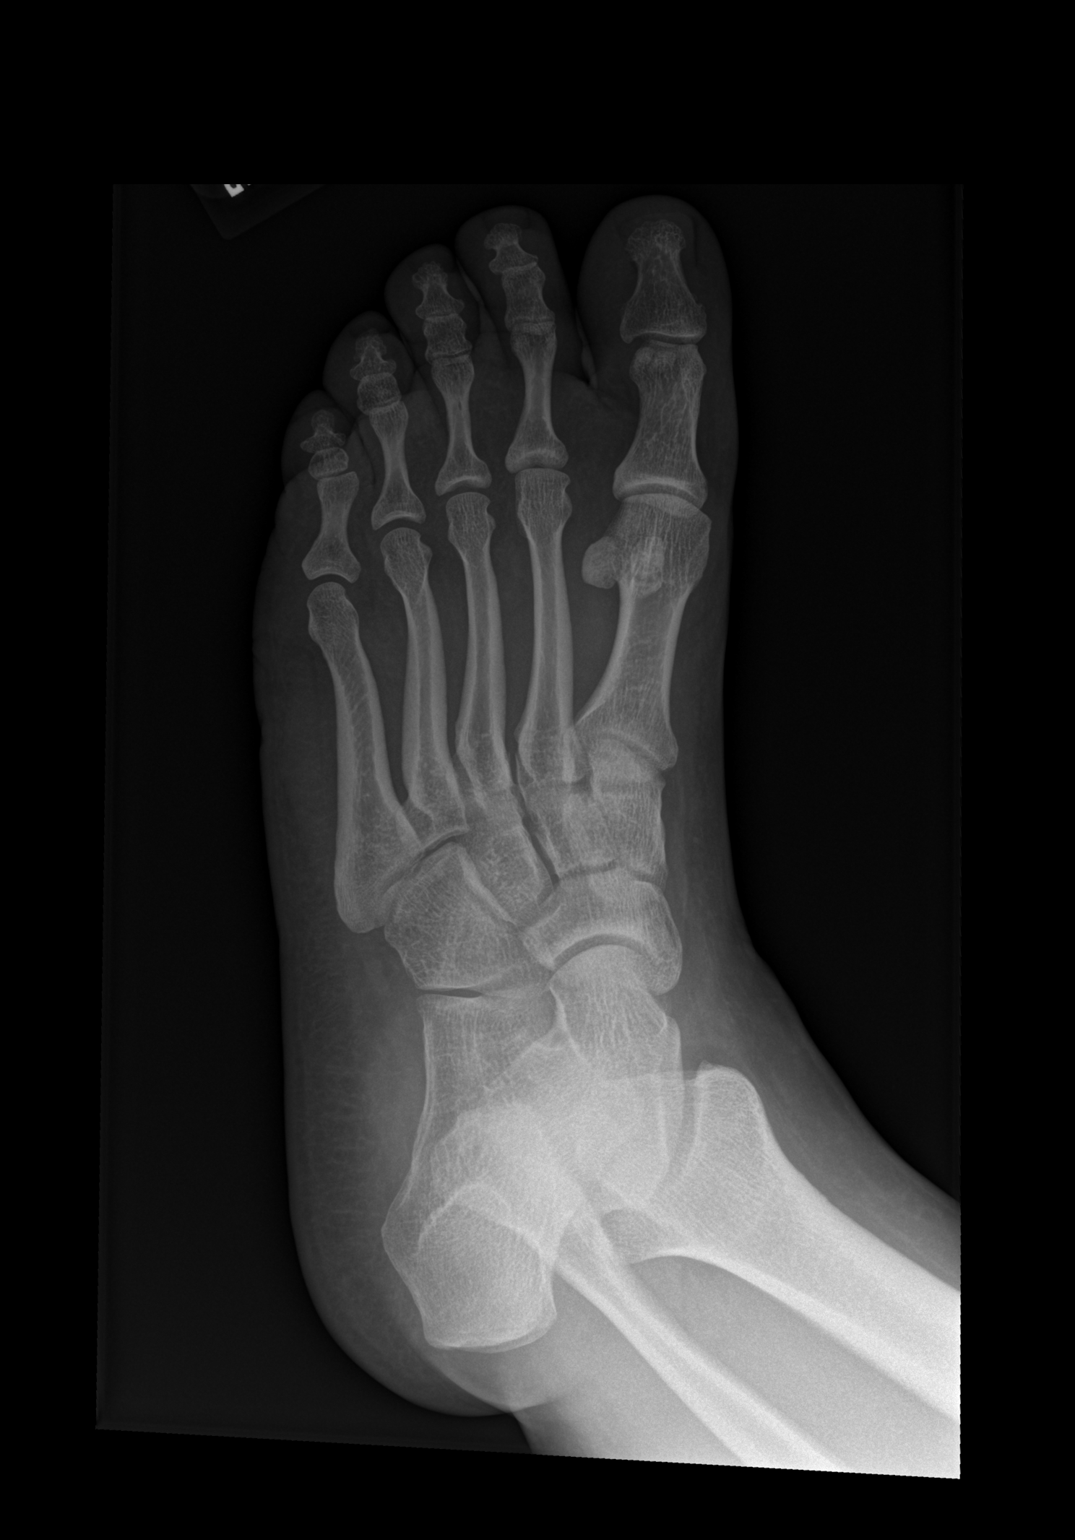

[x foot lat left]
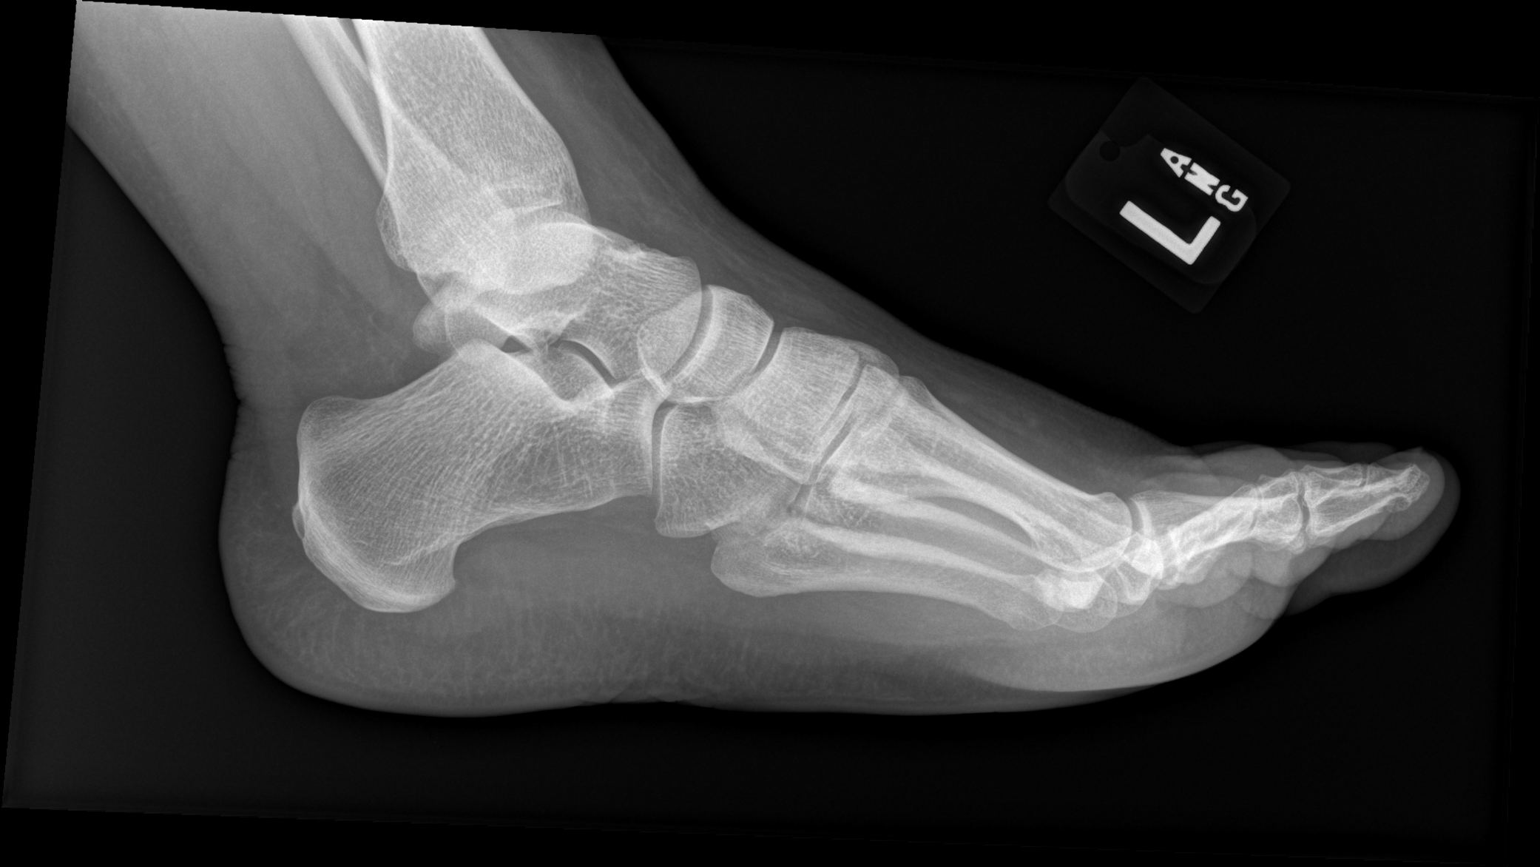

[3 of 3 positions shown; findings below may reference images not displayed]

FINDINGS: There is no evidence of fracture or dislocation. There is no
evidence of arthropathy or other focal bone abnormality. Soft
tissues are unremarkable.
IMPRESSION: Negative.

## 2022-02-02 ENCOUNTER — Other Ambulatory Visit (HOSPITAL_BASED_OUTPATIENT_CLINIC_OR_DEPARTMENT_OTHER): Payer: Self-pay

## 2022-04-05 NOTE — Telephone Encounter (Signed)
Error

## 2022-12-08 ENCOUNTER — Encounter: Payer: Self-pay | Admitting: Internal Medicine

## 2022-12-08 NOTE — Progress Notes (Signed)
Subjective:    Patient ID: Ann [name], female    DOB: 1985-03-14, 37 y.o.   MRN: [id number]      HPI Reshonda is here for  Chief Complaint  Patient presents with   Cough    Dry cough, chest discomfort, fever,body aches and started Wednesday, Daughter was dx with walking pneumonia     She is here for an acute visit for cold symptoms.   Her symptoms started 2 days ago.  Her daughter had walking pneumonia - she is feeling better now.   She is experiencing fever, fatigue, dec appetite, cough - mostly dry, chest tightness, wheeze, sob, nausea, body aches and headaches.   She has tried taking advil     Medications and allergies reviewed with patient and updated if appropriate.  Current Outpatient Medications on File Prior to Visit  Medication Sig Dispense Refill   B COMPLEX VITAMINS PO Take 1 tablet by mouth daily. Reported on 03/09/2015     baricitinib (OLUMIANT) tablet      Cetirizine HCl (ZYRTEC ALLERGY) 10 MG CAPS Take by mouth.     modafinil (PROVIGIL) 200 MG tablet Take 1 tablet (200 mg total) by mouth daily. 30 tablet 5   MULTIPLE VITAMIN PO Take 1 tablet by mouth daily.      tirzepatide [facility]) 5 MG/0.5ML Pen Inject 5 mg into the skin once a week. 6 mL 2   No current facility-administered medications on file prior to visit.    Review of Systems  Constitutional:  Positive for appetite change (intermittent), fatigue and fever.  HENT:  Negative for congestion, ear pain, sinus pressure and sore throat (with coughing).   Respiratory:  Positive for cough, chest tightness, shortness of breath and wheezing.   Cardiovascular:  Negative for chest pain (with coughing).  Gastrointestinal:  Positive for nausea. Negative for diarrhea.  Musculoskeletal:  Positive for myalgias.  Neurological:  Positive for headaches. Negative for light-headedness.       Objective:   Vitals:   12/09/22 0926  BP: 128/84  Pulse: (!) 110  Temp: (!) 102.8 F (39.3 C)  SpO2: 98%    BP Readings from Last 3 Encounters:  12/09/22 128/84  07/29/21 120/74  01/12/21 124/85   Wt Readings from Last 3 Encounters:  12/09/22 218 lb (98.9 kg)  07/29/21 221 lb 6 oz (100.4 kg)  01/12/21 213 lb (96.6 kg)   Body mass index is 38.62 kg/m.    Physical Exam Constitutional:      General: She is not in acute distress.    Appearance: Normal appearance. She is ill-appearing (mild).  HENT:     Head: Normocephalic and atraumatic.     Right Ear: Tympanic membrane, ear canal and external ear normal.     Left Ear: Tympanic membrane, ear canal and external ear normal.     Mouth/Throat:     Mouth: Mucous membranes are moist.     Pharynx: No oropharyngeal exudate or posterior oropharyngeal erythema.  Eyes:     Conjunctiva/sclera: Conjunctivae normal.  Cardiovascular:     Rate and Rhythm: Normal rate and regular rhythm.  Pulmonary:     Effort: Pulmonary effort is normal. No respiratory distress.     Breath sounds: Normal breath sounds. No wheezing or rales.  Musculoskeletal:     Cervical back: Neck supple. No tenderness.  Lymphadenopathy:     Cervical: No cervical adenopathy.  Skin:    General: Skin is warm and dry.  Neurological:  Mental Status: She is alert.            Assessment & Plan:    CAP: Acute Symptoms c/w CAP - her daughter had this recently Covid, flu and rsv tests negative Cxr today Start zpak for possible mycoplasma pna Tussionex cough syrup Q12 Fluids Alternate advil and tylenol for fever, body aches and headaches  Please call if there is no improvement in your symptoms.

## 2022-12-08 NOTE — Patient Instructions (Addendum)
     Have a chest xray downstairs.     Medications changes include :   zpak, cough syrup      Return if symptoms worsen or fail to improve.

## 2022-12-09 ENCOUNTER — Ambulatory Visit (INDEPENDENT_AMBULATORY_CARE_PROVIDER_SITE_OTHER): Payer: Medicaid Other | Admitting: Internal Medicine

## 2022-12-09 ENCOUNTER — Ambulatory Visit (INDEPENDENT_AMBULATORY_CARE_PROVIDER_SITE_OTHER): Payer: Medicaid Other

## 2022-12-09 VITALS — BP 128/84 | HR 110 | Temp 102.8°F | Ht 63.0 in | Wt 218.0 lb

## 2022-12-09 DIAGNOSIS — R0602 Shortness of breath: Secondary | ICD-10-CM | POA: Diagnosis not present

## 2022-12-09 DIAGNOSIS — J189 Pneumonia, unspecified organism: Secondary | ICD-10-CM

## 2022-12-09 DIAGNOSIS — R918 Other nonspecific abnormal finding of lung field: Secondary | ICD-10-CM | POA: Diagnosis not present

## 2022-12-09 LAB — POC INFLUENZA A&B (BINAX/QUICKVUE)
Influenza A, POC: NEGATIVE
Influenza B, POC: NEGATIVE

## 2022-12-09 LAB — POC COVID19 BINAXNOW: SARS Coronavirus 2 Ag: NEGATIVE

## 2022-12-09 LAB — POCT RESPIRATORY SYNCYTIAL VIRUS: RSV Rapid Ag: NEGATIVE

## 2022-12-09 MED ORDER — AZITHROMYCIN 250 MG PO TABS: ORAL_TABLET | ORAL | 0 refills | Status: DC

## 2022-12-09 MED ORDER — HYDROCOD POLI-CHLORPHE POLI ER 10-8 MG/5ML PO SUER: 5.0000 mL | Freq: Two times a day (BID) | ORAL | 0 refills | Status: DC | PRN

## 2022-12-09 NOTE — Addendum Note (Signed)
Addended by: Karma Ganja on: 12/09/2022 12:23 PM   Modules accepted: Orders

## 2023-01-24 DIAGNOSIS — F4322 Adjustment disorder with anxiety: Secondary | ICD-10-CM | POA: Diagnosis not present

## 2023-02-13 DIAGNOSIS — F4322 Adjustment disorder with anxiety: Secondary | ICD-10-CM | POA: Diagnosis not present

## 2023-02-20 DIAGNOSIS — J069 Acute upper respiratory infection, unspecified: Secondary | ICD-10-CM | POA: Diagnosis not present

## 2023-02-21 DIAGNOSIS — F4322 Adjustment disorder with anxiety: Secondary | ICD-10-CM | POA: Diagnosis not present

## 2023-03-03 DIAGNOSIS — F4322 Adjustment disorder with anxiety: Secondary | ICD-10-CM | POA: Diagnosis not present

## 2023-03-03 DIAGNOSIS — N930 Postcoital and contact bleeding: Secondary | ICD-10-CM | POA: Diagnosis not present

## 2023-03-03 DIAGNOSIS — Z30431 Encounter for routine checking of intrauterine contraceptive device: Secondary | ICD-10-CM | POA: Diagnosis not present

## 2023-03-08 DIAGNOSIS — F332 Major depressive disorder, recurrent severe without psychotic features: Secondary | ICD-10-CM | POA: Diagnosis not present

## 2023-03-09 DIAGNOSIS — Z30431 Encounter for routine checking of intrauterine contraceptive device: Secondary | ICD-10-CM | POA: Diagnosis not present

## 2023-03-16 DIAGNOSIS — F332 Major depressive disorder, recurrent severe without psychotic features: Secondary | ICD-10-CM | POA: Diagnosis not present

## 2023-03-21 DIAGNOSIS — F332 Major depressive disorder, recurrent severe without psychotic features: Secondary | ICD-10-CM | POA: Diagnosis not present

## 2023-03-27 ENCOUNTER — Encounter: Payer: Self-pay | Admitting: Emergency Medicine

## 2023-03-27 ENCOUNTER — Ambulatory Visit (INDEPENDENT_AMBULATORY_CARE_PROVIDER_SITE_OTHER): Payer: BC Managed Care – PPO | Admitting: Emergency Medicine

## 2023-03-27 VITALS — BP 122/88 | HR 94 | Temp 99.0°F | Ht 63.0 in | Wt 206.0 lb

## 2023-03-27 DIAGNOSIS — Z6836 Body mass index (BMI) 36.0-36.9, adult: Secondary | ICD-10-CM

## 2023-03-27 DIAGNOSIS — E6609 Other obesity due to excess calories: Secondary | ICD-10-CM | POA: Diagnosis not present

## 2023-03-27 DIAGNOSIS — E66812 Obesity, class 2: Secondary | ICD-10-CM | POA: Diagnosis not present

## 2023-03-27 DIAGNOSIS — E785 Hyperlipidemia, unspecified: Secondary | ICD-10-CM | POA: Insufficient documentation

## 2023-03-27 DIAGNOSIS — D8989 Other specified disorders involving the immune mechanism, not elsewhere classified: Secondary | ICD-10-CM | POA: Diagnosis not present

## 2023-03-27 DIAGNOSIS — F332 Major depressive disorder, recurrent severe without psychotic features: Secondary | ICD-10-CM | POA: Diagnosis not present

## 2023-03-27 DIAGNOSIS — N83202 Unspecified ovarian cyst, left side: Secondary | ICD-10-CM | POA: Insufficient documentation

## 2023-03-27 DIAGNOSIS — L639 Alopecia areata, unspecified: Secondary | ICD-10-CM

## 2023-03-27 NOTE — Assessment & Plan Note (Signed)
 Diet and nutrition discussed. Advised to decrease amount of daily carbohydrate intake and daily calories. Benefit of exercise discussed.
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
## 2023-03-27 NOTE — Assessment & Plan Note (Signed)
 Sees dermatologist on a regular basis Was started on Olumiant Tolerating medication well

## 2023-03-27 NOTE — Assessment & Plan Note (Signed)
 Sees gynecologist on a regular basis Pelvic ultrasound done about 3 weeks ago Occasional pelvic pain Irregular menstrual periods

## 2023-03-27 NOTE — Assessment & Plan Note (Signed)
 Recent lipid profile shows elevated cholesterol, triglycerides, and LDL cholesterol Diet and nutrition discussed Cardiovascular risks associated with dyslipidemia discussed Does not want to start cholesterol medication Benefits of exercise discussed

## 2023-03-27 NOTE — Assessment & Plan Note (Signed)
 Chronic stable condition

## 2023-03-27 NOTE — Patient Instructions (Signed)

## 2023-04-05 DIAGNOSIS — F332 Major depressive disorder, recurrent severe without psychotic features: Secondary | ICD-10-CM | POA: Diagnosis not present

## 2023-04-06 DIAGNOSIS — N83209 Unspecified ovarian cyst, unspecified side: Secondary | ICD-10-CM | POA: Diagnosis not present

## 2023-04-20 DIAGNOSIS — F332 Major depressive disorder, recurrent severe without psychotic features: Secondary | ICD-10-CM | POA: Diagnosis not present

## 2023-04-27 DIAGNOSIS — F332 Major depressive disorder, recurrent severe without psychotic features: Secondary | ICD-10-CM | POA: Diagnosis not present

## 2023-05-07 DIAGNOSIS — F332 Major depressive disorder, recurrent severe without psychotic features: Secondary | ICD-10-CM | POA: Diagnosis not present

## 2023-05-07 DIAGNOSIS — F411 Generalized anxiety disorder: Secondary | ICD-10-CM | POA: Diagnosis not present

## 2023-05-08 DIAGNOSIS — F332 Major depressive disorder, recurrent severe without psychotic features: Secondary | ICD-10-CM | POA: Diagnosis not present

## 2023-05-08 DIAGNOSIS — F411 Generalized anxiety disorder: Secondary | ICD-10-CM | POA: Diagnosis not present

## 2023-05-09 DIAGNOSIS — F332 Major depressive disorder, recurrent severe without psychotic features: Secondary | ICD-10-CM | POA: Diagnosis not present

## 2023-05-09 DIAGNOSIS — F411 Generalized anxiety disorder: Secondary | ICD-10-CM | POA: Diagnosis not present

## 2023-05-16 DIAGNOSIS — F332 Major depressive disorder, recurrent severe without psychotic features: Secondary | ICD-10-CM | POA: Diagnosis not present

## 2023-05-23 DIAGNOSIS — D2272 Melanocytic nevi of left lower limb, including hip: Secondary | ICD-10-CM | POA: Diagnosis not present

## 2023-05-23 DIAGNOSIS — L631 Alopecia universalis: Secondary | ICD-10-CM | POA: Diagnosis not present

## 2023-05-23 DIAGNOSIS — D2271 Melanocytic nevi of right lower limb, including hip: Secondary | ICD-10-CM | POA: Diagnosis not present

## 2023-05-23 DIAGNOSIS — D2262 Melanocytic nevi of left upper limb, including shoulder: Secondary | ICD-10-CM | POA: Diagnosis not present

## 2023-05-23 DIAGNOSIS — D2261 Melanocytic nevi of right upper limb, including shoulder: Secondary | ICD-10-CM | POA: Diagnosis not present

## 2023-05-23 DIAGNOSIS — F332 Major depressive disorder, recurrent severe without psychotic features: Secondary | ICD-10-CM | POA: Diagnosis not present

## 2023-05-23 DIAGNOSIS — D225 Melanocytic nevi of trunk: Secondary | ICD-10-CM | POA: Diagnosis not present

## 2023-05-24 DIAGNOSIS — L638 Other alopecia areata: Secondary | ICD-10-CM | POA: Diagnosis not present

## 2023-05-26 ENCOUNTER — Encounter: Payer: Self-pay | Admitting: Emergency Medicine

## 2023-05-29 DIAGNOSIS — F332 Major depressive disorder, recurrent severe without psychotic features: Secondary | ICD-10-CM | POA: Diagnosis not present

## 2023-05-30 ENCOUNTER — Encounter: Payer: Self-pay | Admitting: Emergency Medicine

## 2023-05-30 ENCOUNTER — Telehealth (INDEPENDENT_AMBULATORY_CARE_PROVIDER_SITE_OTHER): Admitting: Emergency Medicine

## 2023-05-30 VITALS — Ht 63.0 in

## 2023-05-30 DIAGNOSIS — D8989 Other specified disorders involving the immune mechanism, not elsewhere classified: Secondary | ICD-10-CM | POA: Diagnosis not present

## 2023-05-30 DIAGNOSIS — L639 Alopecia areata, unspecified: Secondary | ICD-10-CM

## 2023-05-30 DIAGNOSIS — E785 Hyperlipidemia, unspecified: Secondary | ICD-10-CM | POA: Diagnosis not present

## 2023-05-30 NOTE — Assessment & Plan Note (Signed)
 Sees dermatologist on a regular basis Was started on Olumiant Tolerating medication well
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
## 2023-05-30 NOTE — Assessment & Plan Note (Signed)
 Recent lipid profile from 10 days ago shows total cholesterol of 203, improved from before. Triglycerides back to normal at 147 LDL cholesterol 138 down from 140 Trend is down.  Slowly improving. Does not recommend medication at this time Diet and nutrition discussed

## 2023-05-30 NOTE — Assessment & Plan Note (Signed)
 Chronic stable condition

## 2023-06-01 DIAGNOSIS — F332 Major depressive disorder, recurrent severe without psychotic features: Secondary | ICD-10-CM | POA: Diagnosis not present

## 2023-06-02 DIAGNOSIS — F332 Major depressive disorder, recurrent severe without psychotic features: Secondary | ICD-10-CM | POA: Diagnosis not present

## 2023-06-05 DIAGNOSIS — F332 Major depressive disorder, recurrent severe without psychotic features: Secondary | ICD-10-CM | POA: Diagnosis not present

## 2023-06-06 DIAGNOSIS — F332 Major depressive disorder, recurrent severe without psychotic features: Secondary | ICD-10-CM | POA: Diagnosis not present

## 2023-06-07 DIAGNOSIS — F332 Major depressive disorder, recurrent severe without psychotic features: Secondary | ICD-10-CM | POA: Diagnosis not present

## 2023-06-08 DIAGNOSIS — F332 Major depressive disorder, recurrent severe without psychotic features: Secondary | ICD-10-CM | POA: Diagnosis not present

## 2023-06-09 DIAGNOSIS — R87610 Atypical squamous cells of undetermined significance on cytologic smear of cervix (ASC-US): Secondary | ICD-10-CM | POA: Diagnosis not present

## 2023-06-09 DIAGNOSIS — Z130 Encounter for screening for diseases of the blood and blood-forming organs and certain disorders involving the immune mechanism: Secondary | ICD-10-CM | POA: Diagnosis not present

## 2023-06-09 DIAGNOSIS — Z01419 Encounter for gynecological examination (general) (routine) without abnormal findings: Secondary | ICD-10-CM | POA: Diagnosis not present

## 2023-06-12 DIAGNOSIS — F332 Major depressive disorder, recurrent severe without psychotic features: Secondary | ICD-10-CM | POA: Diagnosis not present

## 2023-06-13 DIAGNOSIS — F332 Major depressive disorder, recurrent severe without psychotic features: Secondary | ICD-10-CM | POA: Diagnosis not present

## 2023-06-14 DIAGNOSIS — F332 Major depressive disorder, recurrent severe without psychotic features: Secondary | ICD-10-CM | POA: Diagnosis not present

## 2023-06-15 DIAGNOSIS — F332 Major depressive disorder, recurrent severe without psychotic features: Secondary | ICD-10-CM | POA: Diagnosis not present

## 2023-06-16 DIAGNOSIS — F332 Major depressive disorder, recurrent severe without psychotic features: Secondary | ICD-10-CM | POA: Diagnosis not present

## 2023-06-20 DIAGNOSIS — F332 Major depressive disorder, recurrent severe without psychotic features: Secondary | ICD-10-CM | POA: Diagnosis not present

## 2023-06-21 DIAGNOSIS — F332 Major depressive disorder, recurrent severe without psychotic features: Secondary | ICD-10-CM | POA: Diagnosis not present

## 2023-06-22 DIAGNOSIS — F332 Major depressive disorder, recurrent severe without psychotic features: Secondary | ICD-10-CM | POA: Diagnosis not present

## 2023-06-23 DIAGNOSIS — F332 Major depressive disorder, recurrent severe without psychotic features: Secondary | ICD-10-CM | POA: Diagnosis not present

## 2023-06-26 DIAGNOSIS — F332 Major depressive disorder, recurrent severe without psychotic features: Secondary | ICD-10-CM | POA: Diagnosis not present

## 2023-06-27 DIAGNOSIS — F332 Major depressive disorder, recurrent severe without psychotic features: Secondary | ICD-10-CM | POA: Diagnosis not present

## 2023-06-28 DIAGNOSIS — F332 Major depressive disorder, recurrent severe without psychotic features: Secondary | ICD-10-CM | POA: Diagnosis not present

## 2023-06-30 DIAGNOSIS — F332 Major depressive disorder, recurrent severe without psychotic features: Secondary | ICD-10-CM | POA: Diagnosis not present

## 2023-07-03 DIAGNOSIS — F332 Major depressive disorder, recurrent severe without psychotic features: Secondary | ICD-10-CM | POA: Diagnosis not present

## 2023-07-05 DIAGNOSIS — F332 Major depressive disorder, recurrent severe without psychotic features: Secondary | ICD-10-CM | POA: Diagnosis not present

## 2023-07-06 DIAGNOSIS — F332 Major depressive disorder, recurrent severe without psychotic features: Secondary | ICD-10-CM | POA: Diagnosis not present

## 2023-07-07 DIAGNOSIS — F332 Major depressive disorder, recurrent severe without psychotic features: Secondary | ICD-10-CM | POA: Diagnosis not present

## 2023-07-10 DIAGNOSIS — F332 Major depressive disorder, recurrent severe without psychotic features: Secondary | ICD-10-CM | POA: Diagnosis not present

## 2023-07-11 DIAGNOSIS — F332 Major depressive disorder, recurrent severe without psychotic features: Secondary | ICD-10-CM | POA: Diagnosis not present

## 2023-07-31 DIAGNOSIS — F332 Major depressive disorder, recurrent severe without psychotic features: Secondary | ICD-10-CM | POA: Diagnosis not present

## 2023-08-16 DIAGNOSIS — F332 Major depressive disorder, recurrent severe without psychotic features: Secondary | ICD-10-CM | POA: Diagnosis not present

## 2023-09-08 ENCOUNTER — Ambulatory Visit: Admitting: "Endocrinology

## 2023-10-16 DIAGNOSIS — J22 Unspecified acute lower respiratory infection: Secondary | ICD-10-CM | POA: Diagnosis not present

## 2023-11-21 ENCOUNTER — Other Ambulatory Visit: Payer: Self-pay

## 2023-11-21 ENCOUNTER — Emergency Department

## 2023-11-21 ENCOUNTER — Emergency Department
Admission: EM | Admit: 2023-11-21 | Discharge: 2023-11-21 | Disposition: A | Discharge status: Home / Self Care | Attending: Emergency Medicine | Admitting: Emergency Medicine

## 2023-11-21 DIAGNOSIS — R0789 Other chest pain: Secondary | ICD-10-CM | POA: Insufficient documentation

## 2023-11-21 DIAGNOSIS — R079 Chest pain, unspecified: Secondary | ICD-10-CM | POA: Diagnosis not present

## 2023-11-21 LAB — CBC
HCT: 40.8 % (ref 36.0–46.0)
Hemoglobin: 13 g/dL (ref 12.0–15.0)
MCH: 27.7 pg (ref 26.0–34.0)
MCHC: 31.9 g/dL (ref 30.0–36.0)
MCV: 86.8 fL (ref 80.0–100.0)
Platelets: 442 K/uL — ABNORMAL HIGH (ref 150–400)
RBC: 4.7 MIL/uL (ref 3.87–5.11)
RDW: 12.1 % (ref 11.5–15.5)
WBC: 10.5 K/uL (ref 4.0–10.5)
nRBC: 0 % (ref 0.0–0.2)

## 2023-11-21 LAB — BASIC METABOLIC PANEL WITH GFR
Anion gap: 10 (ref 5–15)
BUN: 15 mg/dL (ref 6–20)
CO2: 22 mmol/L (ref 22–32)
Calcium: 9.1 mg/dL (ref 8.9–10.3)
Chloride: 106 mmol/L (ref 98–111)
Creatinine, Ser: 0.7 mg/dL (ref 0.44–1.00)
GFR, Estimated: >60 mL/min (ref >60)
Glucose, Bld: 103 mg/dL — ABNORMAL HIGH (ref 70–99)
Potassium: 4 mmol/L (ref 3.5–5.1)
Sodium: 138 mmol/L (ref 135–145)

## 2023-11-21 LAB — TROPONIN I (HIGH SENSITIVITY)
Troponin I (High Sensitivity): <2 ng/L (ref <18)
Troponin I (High Sensitivity): <2 ng/L (ref <18)

## 2023-11-21 LAB — MAGNESIUM: Magnesium: 2.1 mg/dL (ref 1.7–2.4)

## 2023-11-21 NOTE — ED Triage Notes (Signed)
 Pt presents to the ED via POV from home with mid to right chest pain x1 hr. Pt reports that the pain radiates into her right shoulder.

## 2023-11-21 NOTE — ED Provider Notes (Signed)
 [facility] Provider Note    Event Date/Time   First MD Initiated Contact with Patient 11/21/23 1158     (approximate)   History   Chest Pain   HPI  Ann [name] is a 38 year old female presenting to the ER for evaluation of chest pain. About an hour prior to presentation, patient was at work when she had onset of chest pain described as a tightness initially over the right side of her chest, later over the left side of her chest.  Not specifically exertional, not affected with positional changes.  Currently reports symptoms are largely improved, has mild ongoing discomfort.  No leg swelling, recent travel, OCP use.    Physical Exam   Triage Vital Signs: ED Triage Vitals [11/21/23 1113]  Encounter Vitals Group     BP (!) 154/92     Girls Systolic BP Percentile      Girls Diastolic BP Percentile      Boys Systolic BP Percentile      Boys Diastolic BP Percentile      Pulse Rate 90     Resp 18     Temp 98.7 F (37.1 C)     Temp Source Oral     SpO2 100 %     Weight 215 lb (97.5 kg)     Height 5' 3 (1.6 m)     Head Circumference      Peak Flow      Pain Score 4     Pain Loc      Pain Education      Exclude from Growth Chart     Most recent vital signs: Vitals:   11/21/23 1500 11/21/23 1515  BP:    Pulse: 99 88  Resp: 16 16  Temp:    SpO2: 100% 100%     General: Awake, interactive  CV:  Good peripheral perfusion Resp:  Unlabored respirations, lungs clear to auscultation Chest wall: Nontender to palpation Abd:  Nondistended.  Neuro:  Symmetric facial movement, fluid speech   ED Results / Procedures / Treatments   Labs (all labs ordered are listed, but only abnormal results are displayed) Labs Reviewed  BASIC METABOLIC PANEL WITH GFR - Abnormal; Notable for the following components:      Result Value   Glucose, Bld 103 (*)    All other components within normal limits  CBC - Abnormal; Notable for the following components:    Platelets 442 (*)    All other components within normal limits  MAGNESIUM  POC URINE PREG, ED  TROPONIN I (HIGH SENSITIVITY)  TROPONIN I (HIGH SENSITIVITY)     EKG EKG independently reviewed and interpreted by myself demonstrates:  EKG demonstrates normal sinus rhythm at a rate of 98, PR 150, QRS 76, QTc 441, no acute ST changes  RADIOLOGY Imaging independently reviewed and interpreted by myself demonstrates:  CXR without focal consolidation   Formal Radiology Read:  DG Chest 2 View Result Date: 11/21/2023 CLINICAL DATA:  Right chest pain. EXAM: CHEST - 2 VIEW COMPARISON:  December 09, 2022 FINDINGS: The heart size and mediastinal contours are within normal limits. Both lungs are clear. The visualized skeletal structures are unremarkable. IMPRESSION: No active cardiopulmonary disease. Electronically Signed   By: Suzen Dials M.D.   On: 11/21/2023 12:45    PROCEDURES:  Critical Care performed: No  Procedures   MEDICATIONS ORDERED IN ED: Medications - No data to display   IMPRESSION / MDM / ASSESSMENT AND PLAN /  ED COURSE  I reviewed the triage vital signs and the nursing notes.  Differential diagnosis includes, but is not limited to ACS, pneumonia, pneumothorax, low risk PE and PERC negative, musculoskeletal strain, arrhythmia, stress-mediated physiologic response  Patient's presentation is most consistent with acute presentation with potential threat to life or bodily function.  Presents with chest pain. Labs, EKG, CXR reassuring.  Negative initial troponin.  Symptoms did start shortly prior to presentation so we will plan for repeat.  Repeat troponin returned within normal limits. Low risk HEART score. Low suspicion emergent process. Do think patient is stable for discharge with close outpatient follow-up.  Patient does report that she has seen her PCP previously for chest discomfort and it was recommended that she see a cardiologist.  With this we will place an  elective referral for follow-up with cardiology.  Strict return precautions provided.  Patient discharged in stable condition.      FINAL CLINICAL IMPRESSION(S) / ED DIAGNOSES   Final diagnoses:  Nonspecific chest pain     Rx / DC Orders   ED Discharge Orders          Ordered    Ambulatory referral to Cardiology       Comments: If you have not heard from the Cardiology office within the next 72 hours please call [phone].   11/21/23 1553             Note:  This document was prepared using Dragon voice recognition software and may include unintentional dictation errors.   Levander Slate, MD 11/21/23 [phone]

## 2023-11-21 NOTE — ED Notes (Signed)
 Lab called to obtain repeat troponin. 2 nurses attempted but were unsuccessful.

## 2023-11-21 NOTE — Discharge Instructions (Addendum)
 You were seen in the emergency department today for evaluation of your chest pain.  Your testing was fortunately overall reassuring.  You can follow-up with your primary care doctor for further evaluation.  I have put a referral in for elective follow-up with cardiology as well.  Return to the ER for new or worsening symptoms.

## 2023-11-22 ENCOUNTER — Encounter: Payer: Self-pay | Admitting: Emergency Medicine

## 2023-11-27 ENCOUNTER — Encounter: Payer: Self-pay | Admitting: Emergency Medicine

## 2023-11-27 ENCOUNTER — Ambulatory Visit (INDEPENDENT_AMBULATORY_CARE_PROVIDER_SITE_OTHER): Admitting: Emergency Medicine

## 2023-11-27 VITALS — BP 124/82 | HR 82 | Temp 98.9°F | Ht 63.0 in | Wt 218.0 lb

## 2023-11-27 DIAGNOSIS — Z09 Encounter for follow-up examination after completed treatment for conditions other than malignant neoplasm: Secondary | ICD-10-CM

## 2023-11-27 DIAGNOSIS — R079 Chest pain, unspecified: Secondary | ICD-10-CM | POA: Insufficient documentation

## 2023-11-27 DIAGNOSIS — F4323 Adjustment disorder with mixed anxiety and depressed mood: Secondary | ICD-10-CM | POA: Insufficient documentation

## 2023-11-27 NOTE — Assessment & Plan Note (Signed)
 Clinically stable.  No red flag signs or symptoms. No significant risk factors for CAD Unremarkable exam Differential diagnosis discussed Stress contributing a great deal.  Mental health discussed Recommend calcium score cardiac CT

## 2023-11-27 NOTE — Patient Instructions (Signed)

## 2023-11-27 NOTE — Assessment & Plan Note (Addendum)
 Currently active and affecting quality of life Known triggers Mental health management discussed Recently stopped seeing therapist.  Recommend to call the office back and schedule appointment to see psychiatrist. Does not want to start medication at this time.
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
## 2023-12-14 ENCOUNTER — Ambulatory Visit (HOSPITAL_BASED_OUTPATIENT_CLINIC_OR_DEPARTMENT_OTHER)
Admission: RE | Admit: 2023-12-14 | Discharge: 2023-12-14 | Disposition: A | Discharge status: Home / Self Care | Payer: Self-pay | Source: Ambulatory Visit | Attending: Emergency Medicine | Admitting: Emergency Medicine

## 2023-12-14 DIAGNOSIS — R079 Chest pain, unspecified: Secondary | ICD-10-CM | POA: Insufficient documentation

## 2023-12-15 ENCOUNTER — Ambulatory Visit: Payer: Self-pay | Admitting: Emergency Medicine
